# Patient Record
Sex: Female | Born: 1935 | Race: White | Hispanic: No | Smoking: Never smoker
Health system: Southern US, Community
[De-identification: ages and names within clinical notes are randomized; demographics above are authoritative.]

## PROBLEM LIST (undated history)

## (undated) DIAGNOSIS — I480 Paroxysmal atrial fibrillation: Secondary | ICD-10-CM

## (undated) DIAGNOSIS — E785 Hyperlipidemia, unspecified: Secondary | ICD-10-CM

## (undated) DIAGNOSIS — I350 Nonrheumatic aortic (valve) stenosis: Secondary | ICD-10-CM

## (undated) DIAGNOSIS — Z7901 Long term (current) use of anticoagulants: Secondary | ICD-10-CM

## (undated) DIAGNOSIS — D510 Vitamin B12 deficiency anemia due to intrinsic factor deficiency: Secondary | ICD-10-CM

## (undated) DIAGNOSIS — M8580 Other specified disorders of bone density and structure, unspecified site: Secondary | ICD-10-CM

## (undated) DIAGNOSIS — K449 Diaphragmatic hernia without obstruction or gangrene: Secondary | ICD-10-CM

## (undated) DIAGNOSIS — R42 Dizziness and giddiness: Secondary | ICD-10-CM

## (undated) DIAGNOSIS — S4292XA Fracture of left shoulder girdle, part unspecified, initial encounter for closed fracture: Secondary | ICD-10-CM

## (undated) DIAGNOSIS — E538 Deficiency of other specified B group vitamins: Secondary | ICD-10-CM

## (undated) DIAGNOSIS — M1990 Unspecified osteoarthritis, unspecified site: Secondary | ICD-10-CM

## (undated) DIAGNOSIS — R0602 Shortness of breath: Secondary | ICD-10-CM

## (undated) DIAGNOSIS — R0601 Orthopnea: Secondary | ICD-10-CM

## (undated) DIAGNOSIS — R600 Localized edema: Secondary | ICD-10-CM

## (undated) DIAGNOSIS — I779 Disorder of arteries and arterioles, unspecified: Secondary | ICD-10-CM

## (undated) DIAGNOSIS — T148XXA Other injury of unspecified body region, initial encounter: Secondary | ICD-10-CM

## (undated) DIAGNOSIS — D509 Iron deficiency anemia, unspecified: Secondary | ICD-10-CM

## (undated) DIAGNOSIS — Z952 Presence of prosthetic heart valve: Secondary | ICD-10-CM

## (undated) DIAGNOSIS — I10 Essential (primary) hypertension: Secondary | ICD-10-CM

## (undated) DIAGNOSIS — I2510 Atherosclerotic heart disease of native coronary artery without angina pectoris: Secondary | ICD-10-CM

## (undated) DIAGNOSIS — K219 Gastro-esophageal reflux disease without esophagitis: Secondary | ICD-10-CM

## (undated) DIAGNOSIS — I2119 ST elevation (STEMI) myocardial infarction involving other coronary artery of inferior wall: Secondary | ICD-10-CM

## (undated) DIAGNOSIS — I472 Ventricular tachycardia: Secondary | ICD-10-CM

## (undated) DIAGNOSIS — I4729 Other ventricular tachycardia: Secondary | ICD-10-CM

## (undated) DIAGNOSIS — I739 Peripheral vascular disease, unspecified: Secondary | ICD-10-CM

## (undated) DIAGNOSIS — R0789 Other chest pain: Secondary | ICD-10-CM

## (undated) DIAGNOSIS — E119 Type 2 diabetes mellitus without complications: Secondary | ICD-10-CM

## (undated) DIAGNOSIS — Z86711 Personal history of pulmonary embolism: Secondary | ICD-10-CM

## (undated) HISTORY — DX: Ventricular tachycardia: I47.2

## (undated) HISTORY — DX: Hyperlipidemia, unspecified: E78.5

## (undated) HISTORY — DX: Fracture of left shoulder girdle, part unspecified, initial encounter for closed fracture: S42.92XA

## (undated) HISTORY — DX: ST elevation (STEMI) myocardial infarction involving other coronary artery of inferior wall: I21.19

## (undated) HISTORY — DX: Vitamin B12 deficiency anemia due to intrinsic factor deficiency: D51.0

## (undated) HISTORY — DX: Personal history of pulmonary embolism: Z86.711

## (undated) HISTORY — DX: Disorder of arteries and arterioles, unspecified: I77.9

## (undated) HISTORY — DX: Other chest pain: R07.89

## (undated) HISTORY — DX: Other ventricular tachycardia: I47.29

## (undated) HISTORY — PX: CHOLECYSTECTOMY: SHX55

## (undated) HISTORY — DX: Shortness of breath: R06.02

## (undated) HISTORY — DX: Type 2 diabetes mellitus without complications: E11.9

## (undated) HISTORY — DX: Unspecified osteoarthritis, unspecified site: M19.90

## (undated) HISTORY — PX: OTHER SURGICAL HISTORY: SHX169

## (undated) HISTORY — DX: Diaphragmatic hernia without obstruction or gangrene: K44.9

## (undated) HISTORY — DX: Orthopnea: R06.01

## (undated) HISTORY — DX: Presence of prosthetic heart valve: Z95.2

## (undated) HISTORY — DX: Gastro-esophageal reflux disease without esophagitis: K21.9

## (undated) HISTORY — DX: Paroxysmal atrial fibrillation: I48.0

## (undated) HISTORY — DX: Dizziness and giddiness: R42

## (undated) HISTORY — DX: Iron deficiency anemia, unspecified: D50.9

## (undated) HISTORY — DX: Deficiency of other specified B group vitamins: E53.8

## (undated) HISTORY — DX: Other specified disorders of bone density and structure, unspecified site: M85.80

## (undated) HISTORY — DX: Peripheral vascular disease, unspecified: I73.9

## (undated) HISTORY — DX: Localized edema: R60.0

## (undated) HISTORY — DX: Essential (primary) hypertension: I10

## (undated) HISTORY — DX: Nonrheumatic aortic (valve) stenosis: I35.0

## (undated) HISTORY — DX: Long term (current) use of anticoagulants: Z79.01

## (undated) HISTORY — DX: Other injury of unspecified body region, initial encounter: T14.8XXA

## (undated) HISTORY — DX: Atherosclerotic heart disease of native coronary artery without angina pectoris: I25.10

---

## 1998-11-20 ENCOUNTER — Other Ambulatory Visit: Admission: RE | Admit: 1998-11-20 | Discharge: 1998-11-20 | Payer: Self-pay | Admitting: Family Medicine

## 1999-07-29 ENCOUNTER — Encounter: Payer: Self-pay | Admitting: Gastroenterology

## 1999-07-29 ENCOUNTER — Ambulatory Visit (HOSPITAL_COMMUNITY): Admission: RE | Admit: 1999-07-29 | Discharge: 1999-07-29 | Payer: Self-pay | Admitting: Gastroenterology

## 1999-10-28 ENCOUNTER — Encounter: Payer: Self-pay | Admitting: Gastroenterology

## 1999-10-28 ENCOUNTER — Ambulatory Visit (HOSPITAL_COMMUNITY): Admission: RE | Admit: 1999-10-28 | Discharge: 1999-10-28 | Payer: Self-pay | Admitting: Gastroenterology

## 1999-12-22 ENCOUNTER — Encounter: Payer: Self-pay | Admitting: Family Medicine

## 1999-12-22 ENCOUNTER — Encounter: Admission: RE | Admit: 1999-12-22 | Discharge: 1999-12-22 | Payer: Self-pay | Admitting: Family Medicine

## 1999-12-23 ENCOUNTER — Other Ambulatory Visit: Admission: RE | Admit: 1999-12-23 | Discharge: 1999-12-23 | Payer: Self-pay | Admitting: Family Medicine

## 2000-11-11 ENCOUNTER — Other Ambulatory Visit: Admission: RE | Admit: 2000-11-11 | Discharge: 2000-11-11 | Payer: Self-pay | Admitting: Internal Medicine

## 2001-07-11 ENCOUNTER — Encounter: Admission: RE | Admit: 2001-07-11 | Discharge: 2001-07-11 | Payer: Self-pay | Admitting: Internal Medicine

## 2001-07-11 ENCOUNTER — Encounter: Payer: Self-pay | Admitting: Internal Medicine

## 2002-04-24 ENCOUNTER — Ambulatory Visit (HOSPITAL_COMMUNITY): Admission: RE | Admit: 2002-04-24 | Discharge: 2002-04-24 | Payer: Self-pay | Admitting: Family Medicine

## 2002-04-24 ENCOUNTER — Encounter: Payer: Self-pay | Admitting: Family Medicine

## 2003-02-09 ENCOUNTER — Emergency Department (HOSPITAL_COMMUNITY): Admission: EM | Admit: 2003-02-09 | Discharge: 2003-02-09 | Payer: Self-pay

## 2003-02-09 ENCOUNTER — Encounter: Payer: Self-pay | Admitting: Emergency Medicine

## 2003-02-26 ENCOUNTER — Ambulatory Visit (HOSPITAL_COMMUNITY): Admission: RE | Admit: 2003-02-26 | Discharge: 2003-02-26 | Payer: Self-pay | Admitting: Family Medicine

## 2003-02-26 ENCOUNTER — Encounter: Payer: Self-pay | Admitting: Family Medicine

## 2003-03-05 ENCOUNTER — Encounter: Payer: Self-pay | Admitting: Family Medicine

## 2003-03-05 ENCOUNTER — Ambulatory Visit (HOSPITAL_COMMUNITY): Admission: RE | Admit: 2003-03-05 | Discharge: 2003-03-05 | Payer: Self-pay | Admitting: Family Medicine

## 2003-03-26 ENCOUNTER — Inpatient Hospital Stay (HOSPITAL_COMMUNITY): Admission: RE | Admit: 2003-03-26 | Discharge: 2003-03-28 | Payer: Self-pay | Admitting: Obstetrics and Gynecology

## 2003-03-26 ENCOUNTER — Encounter (INDEPENDENT_AMBULATORY_CARE_PROVIDER_SITE_OTHER): Payer: Self-pay

## 2003-04-08 ENCOUNTER — Emergency Department (HOSPITAL_COMMUNITY): Admission: EM | Admit: 2003-04-08 | Discharge: 2003-04-08 | Payer: Self-pay | Admitting: Emergency Medicine

## 2003-08-13 ENCOUNTER — Ambulatory Visit (HOSPITAL_COMMUNITY): Admission: RE | Admit: 2003-08-13 | Discharge: 2003-08-13 | Payer: Self-pay | Admitting: Family Medicine

## 2003-08-20 ENCOUNTER — Encounter: Admission: RE | Admit: 2003-08-20 | Discharge: 2003-08-20 | Payer: Self-pay | Admitting: Obstetrics and Gynecology

## 2004-02-18 ENCOUNTER — Ambulatory Visit (HOSPITAL_COMMUNITY): Admission: RE | Admit: 2004-02-18 | Discharge: 2004-02-18 | Payer: Self-pay | Admitting: Family Medicine

## 2004-02-27 ENCOUNTER — Ambulatory Visit (HOSPITAL_COMMUNITY): Admission: RE | Admit: 2004-02-27 | Discharge: 2004-02-27 | Payer: Self-pay | Admitting: Family Medicine

## 2004-03-10 ENCOUNTER — Emergency Department (HOSPITAL_COMMUNITY): Admission: EM | Admit: 2004-03-10 | Discharge: 2004-03-10 | Payer: Self-pay | Admitting: Emergency Medicine

## 2004-04-08 ENCOUNTER — Ambulatory Visit (HOSPITAL_COMMUNITY): Admission: RE | Admit: 2004-04-08 | Discharge: 2004-04-09 | Payer: Self-pay | Admitting: General Surgery

## 2004-04-08 ENCOUNTER — Encounter (INDEPENDENT_AMBULATORY_CARE_PROVIDER_SITE_OTHER): Payer: Self-pay | Admitting: *Deleted

## 2004-12-24 ENCOUNTER — Encounter: Admission: RE | Admit: 2004-12-24 | Discharge: 2004-12-24 | Payer: Self-pay | Admitting: Family Medicine

## 2006-04-11 ENCOUNTER — Encounter: Admission: RE | Admit: 2006-04-11 | Discharge: 2006-04-11 | Payer: Self-pay | Admitting: Gastroenterology

## 2006-07-04 ENCOUNTER — Ambulatory Visit (HOSPITAL_COMMUNITY): Admission: RE | Admit: 2006-07-04 | Discharge: 2006-07-04 | Payer: Self-pay | Admitting: Gastroenterology

## 2006-07-22 ENCOUNTER — Encounter: Admission: RE | Admit: 2006-07-22 | Discharge: 2006-07-22 | Payer: Self-pay | Admitting: Surgery

## 2007-04-07 ENCOUNTER — Encounter: Admission: RE | Admit: 2007-04-07 | Discharge: 2007-04-07 | Payer: Self-pay | Admitting: Obstetrics and Gynecology

## 2007-05-05 ENCOUNTER — Encounter: Admission: RE | Admit: 2007-05-05 | Discharge: 2007-05-05 | Payer: Self-pay | Admitting: Surgery

## 2007-06-17 ENCOUNTER — Emergency Department (HOSPITAL_COMMUNITY): Admission: EM | Admit: 2007-06-17 | Discharge: 2007-06-17 | Payer: Self-pay | Admitting: Family Medicine

## 2007-06-19 ENCOUNTER — Ambulatory Visit: Admission: RE | Admit: 2007-06-19 | Discharge: 2007-06-19 | Payer: Self-pay | Admitting: Surgery

## 2007-08-07 ENCOUNTER — Inpatient Hospital Stay (HOSPITAL_COMMUNITY): Admission: RE | Admit: 2007-08-07 | Discharge: 2007-08-09 | Payer: Self-pay | Admitting: Surgery

## 2007-10-04 ENCOUNTER — Encounter: Admission: RE | Admit: 2007-10-04 | Discharge: 2007-10-04 | Payer: Self-pay | Admitting: Surgery

## 2008-04-09 ENCOUNTER — Encounter: Admission: RE | Admit: 2008-04-09 | Discharge: 2008-04-09 | Payer: Self-pay | Admitting: Family Medicine

## 2008-04-18 ENCOUNTER — Encounter: Admission: RE | Admit: 2008-04-18 | Discharge: 2008-04-18 | Payer: Self-pay | Admitting: Family Medicine

## 2008-04-25 ENCOUNTER — Encounter: Admission: RE | Admit: 2008-04-25 | Discharge: 2008-04-25 | Payer: Self-pay | Admitting: Family Medicine

## 2008-06-26 ENCOUNTER — Encounter (INDEPENDENT_AMBULATORY_CARE_PROVIDER_SITE_OTHER): Payer: Self-pay | Admitting: Family Medicine

## 2008-06-26 ENCOUNTER — Ambulatory Visit (HOSPITAL_COMMUNITY): Admission: RE | Admit: 2008-06-26 | Discharge: 2008-06-26 | Payer: Self-pay | Admitting: Family Medicine

## 2008-06-26 ENCOUNTER — Ambulatory Visit: Payer: Self-pay | Admitting: Vascular Surgery

## 2008-12-09 ENCOUNTER — Inpatient Hospital Stay (HOSPITAL_COMMUNITY): Admission: AD | Admit: 2008-12-09 | Discharge: 2008-12-13 | Payer: Self-pay | Admitting: Cardiology

## 2008-12-11 ENCOUNTER — Ambulatory Visit: Payer: Self-pay | Admitting: Surgery

## 2008-12-11 ENCOUNTER — Encounter: Payer: Self-pay | Admitting: Cardiology

## 2008-12-12 DIAGNOSIS — Z952 Presence of prosthetic heart valve: Secondary | ICD-10-CM

## 2008-12-12 DIAGNOSIS — I2510 Atherosclerotic heart disease of native coronary artery without angina pectoris: Secondary | ICD-10-CM | POA: Insufficient documentation

## 2008-12-12 HISTORY — PX: AORTIC VALVE REPLACEMENT: SHX41

## 2008-12-12 HISTORY — DX: Atherosclerotic heart disease of native coronary artery without angina pectoris: I25.10

## 2008-12-12 HISTORY — DX: Presence of prosthetic heart valve: Z95.2

## 2008-12-12 HISTORY — PX: CORONARY ARTERY BYPASS GRAFT: SHX141

## 2008-12-30 ENCOUNTER — Ambulatory Visit: Payer: Self-pay | Admitting: Thoracic Surgery (Cardiothoracic Vascular Surgery)

## 2009-01-06 ENCOUNTER — Encounter: Payer: Self-pay | Admitting: Thoracic Surgery (Cardiothoracic Vascular Surgery)

## 2009-01-06 ENCOUNTER — Ambulatory Visit (HOSPITAL_COMMUNITY)
Admission: RE | Admit: 2009-01-06 | Discharge: 2009-01-06 | Payer: Self-pay | Admitting: Thoracic Surgery (Cardiothoracic Vascular Surgery)

## 2009-01-10 ENCOUNTER — Inpatient Hospital Stay (HOSPITAL_COMMUNITY)
Admission: RE | Admit: 2009-01-10 | Discharge: 2009-01-16 | Payer: Self-pay | Admitting: Thoracic Surgery (Cardiothoracic Vascular Surgery)

## 2009-01-10 ENCOUNTER — Encounter: Payer: Self-pay | Admitting: Thoracic Surgery (Cardiothoracic Vascular Surgery)

## 2009-01-10 ENCOUNTER — Ambulatory Visit: Payer: Self-pay | Admitting: Thoracic Surgery (Cardiothoracic Vascular Surgery)

## 2009-02-10 ENCOUNTER — Encounter
Admission: RE | Admit: 2009-02-10 | Discharge: 2009-02-10 | Payer: Self-pay | Admitting: Thoracic Surgery (Cardiothoracic Vascular Surgery)

## 2009-02-10 ENCOUNTER — Ambulatory Visit: Payer: Self-pay | Admitting: Thoracic Surgery (Cardiothoracic Vascular Surgery)

## 2009-03-20 ENCOUNTER — Encounter: Payer: PPO | Admitting: Cardiology

## 2009-04-07 ENCOUNTER — Ambulatory Visit: Payer: Self-pay | Admitting: Thoracic Surgery (Cardiothoracic Vascular Surgery)

## 2009-04-14 ENCOUNTER — Encounter: Payer: PPO | Admitting: Cardiology

## 2009-07-15 HISTORY — PX: OTHER SURGICAL HISTORY: SHX169

## 2009-12-14 ENCOUNTER — Emergency Department (HOSPITAL_COMMUNITY): Admission: EM | Admit: 2009-12-14 | Discharge: 2009-12-14 | Payer: Self-pay | Admitting: Family Medicine

## 2010-02-12 DIAGNOSIS — S4292XA Fracture of left shoulder girdle, part unspecified, initial encounter for closed fracture: Secondary | ICD-10-CM

## 2010-02-12 DIAGNOSIS — I2119 ST elevation (STEMI) myocardial infarction involving other coronary artery of inferior wall: Secondary | ICD-10-CM

## 2010-02-12 HISTORY — DX: Fracture of left shoulder girdle, part unspecified, initial encounter for closed fracture: S42.92XA

## 2010-02-12 HISTORY — PX: CARDIAC CATHETERIZATION: SHX172

## 2010-02-12 HISTORY — DX: ST elevation (STEMI) myocardial infarction involving other coronary artery of inferior wall: I21.19

## 2010-02-18 ENCOUNTER — Ambulatory Visit: Payer: Self-pay | Admitting: Cardiology

## 2010-02-18 ENCOUNTER — Ambulatory Visit (HOSPITAL_COMMUNITY): Admission: RE | Admit: 2010-02-18 | Discharge: 2010-02-18 | Payer: Self-pay | Admitting: Cardiology

## 2010-02-24 ENCOUNTER — Inpatient Hospital Stay (HOSPITAL_COMMUNITY): Admission: EM | Admit: 2010-02-24 | Discharge: 2010-02-28 | Payer: Self-pay | Admitting: Emergency Medicine

## 2010-02-24 ENCOUNTER — Ambulatory Visit: Payer: Self-pay | Admitting: Internal Medicine

## 2010-02-24 ENCOUNTER — Ambulatory Visit: Payer: Self-pay | Admitting: Cardiology

## 2010-03-09 ENCOUNTER — Ambulatory Visit: Payer: Self-pay | Admitting: Cardiology

## 2010-04-01 ENCOUNTER — Ambulatory Visit: Payer: Self-pay | Admitting: Cardiology

## 2010-04-13 ENCOUNTER — Telehealth (INDEPENDENT_AMBULATORY_CARE_PROVIDER_SITE_OTHER): Payer: Self-pay | Admitting: Radiology

## 2010-04-14 ENCOUNTER — Ambulatory Visit: Payer: Self-pay

## 2010-04-14 ENCOUNTER — Encounter: Payer: Self-pay | Admitting: Cardiovascular Disease

## 2010-04-14 ENCOUNTER — Encounter (HOSPITAL_COMMUNITY)
Admission: RE | Admit: 2010-04-14 | Discharge: 2010-06-13 | Payer: Self-pay | Source: Home / Self Care | Attending: Cardiology | Admitting: Cardiology

## 2010-04-14 ENCOUNTER — Ambulatory Visit: Payer: Self-pay | Admitting: Cardiovascular Disease

## 2010-05-27 ENCOUNTER — Inpatient Hospital Stay (HOSPITAL_COMMUNITY)
Admission: RE | Admit: 2010-05-27 | Discharge: 2010-05-29 | Payer: Self-pay | Source: Home / Self Care | Attending: Orthopedic Surgery | Admitting: Orthopedic Surgery

## 2010-07-06 ENCOUNTER — Ambulatory Visit: Payer: Self-pay | Admitting: Cardiology

## 2010-07-14 NOTE — Progress Notes (Signed)
Summary: nuc pre-procedure  Phone Note Outgoing Call   Call placed by: Domenic Polite, CNMT,  April 13, 2010 2:11 PM Call placed to: Patient Reason for Call: Confirm/change Appt Summary of Call: Reviewed information on [employer] Information Sheet (see scanned document for further details).  Spoke with patient.  Initial call taken by: Domenic Polite, CNMT,  April 13, 2010 2:11 PM     Nuclear Med Background Indications for Stress Test: Evaluation for Ischemia, Surgical Clearance  Indications Comments: Pending Lt arm surgery  History: CABG, Echo, Heart Catheterization, Myocardial Infarction  History Comments: 6/10 Echo EF= 55-60% , 7/10 CABG LAD /AVR, 02/24/10 INF MI , 02/25/10 Cath atretic LIMA-LAD / Thombus RCA, H/O A-Fib , H/O NSVT     Nuclear Pre-Procedure Cardiac Risk Factors: Hypertension, Lipids, NIDDM

## 2010-07-14 NOTE — Assessment & Plan Note (Signed)
Summary: Cardiology Nuclear Testing  Nuclear Med Background Indications for Stress Test: Evaluation for Ischemia, Surgical Clearance, Graft Patency  Indications Comments: Pending (L) arm surgery by Dr. Fayrene Fearing [place]  History: CABG, Echo, Heart Catheterization, Myocardial Infarction  History Comments: 6/10 [id number]  Symptoms: Chest Pain, Fatigue  Symptoms Comments: Last episode of [id number] since MI, 02/24/10.   Nuclear Pre-Procedure Cardiac Risk Factors: Family History - CAD, Hypertension, Lipids, NIDDM, Obesity Caffeine/Decaff Intake: None NPO After: 8:00 PM Lungs: Clear.  O2 Sat 98% on RA. IV 0.9% NS with Angio Cath: 24g     IV Site: R Hand IV Started by: Irean Hong, RN Chest Size (in) 38     Cup Size C     Height (in): 65.5 Weight (lb): 202 BMI: 33.22 Tech Comments: Held metoprolol this am.  Nuclear Med Study 1 or 2 day study:  1 day     Stress Test Type:  Eugenie Birks Reading MD:  Kristeen Miss, MD     Referring MD:  Cassell Clement, MD Resting Radionuclide:  Technetium 68m Tetrofosmin     Resting Radionuclide Dose:  11.0 mCi  Stress Radionuclide:  Technetium 33m Tetrofosmin     Stress Radionuclide Dose:  33.0 mCi   Stress Protocol   Lexiscan: 0.4 mg   Stress Test Technologist:  [employer], CMA-N     Nuclear Technologist:  Domenic Polite, CNMT  Rest Procedure  Myocardial perfusion imaging was performed at rest 45 minutes following the intravenous administration of Technetium 70m Tetrofosmin.  Stress Procedure  The patient received IV Lexiscan 0.4 mg over 15-seconds.  Technetium 65m Tetrofosmin injected at 30-seconds.  There were no significant changes with infusion; occasional PVC's.  Quantitative spect images were obtained after a 45 minute delay.  QPS Raw Data Images:  Normal; no motion artifact; normal heart/lung ratio. Stress Images:  Normal homogeneous uptake in all areas of the myocardium. Rest Images:  Normal homogeneous uptake in all areas of the  myocardium. Subtraction (SDS):  No evidence of ischemia. Transient Ischemic Dilatation:  .96  (Normal <1.22)  Lung/Heart Ratio:  .39  (Normal <0.45)  Quantitative Gated Spect Images QGS EDV:  51 ml QGS ESV:  12 ml QGS EF:  77 % QGS cine images:  There is vigorous LV systolic function.  Findings Normal nuclear study      Overall Impression  Exercise Capacity: Lexiscan with no exercise. BP Response: Normal blood pressure response. Clinical Symptoms: No chest pain ECG Impression: No significant ST segment change suggestive of ischemia.  There were frequent PVCs. Overall Impression: Normal stress nuclear study. Overall Impression Comments: No evidence of ischemia.  Normal LV function.

## 2010-07-17 ENCOUNTER — Ambulatory Visit (INDEPENDENT_AMBULATORY_CARE_PROVIDER_SITE_OTHER): Payer: Medicare Other | Admitting: *Deleted

## 2010-07-17 DIAGNOSIS — I119 Hypertensive heart disease without heart failure: Secondary | ICD-10-CM

## 2010-07-17 DIAGNOSIS — I4891 Unspecified atrial fibrillation: Secondary | ICD-10-CM

## 2010-07-17 DIAGNOSIS — Z79899 Other long term (current) drug therapy: Secondary | ICD-10-CM

## 2010-08-04 ENCOUNTER — Other Ambulatory Visit (INDEPENDENT_AMBULATORY_CARE_PROVIDER_SITE_OTHER): Payer: Medicare Other

## 2010-08-04 DIAGNOSIS — I10 Essential (primary) hypertension: Secondary | ICD-10-CM

## 2010-08-04 DIAGNOSIS — I2510 Atherosclerotic heart disease of native coronary artery without angina pectoris: Secondary | ICD-10-CM

## 2010-08-25 LAB — COMPREHENSIVE METABOLIC PANEL
ALT: 13 U/L (ref 0–35)
AST: 20 U/L (ref 0–37)
Albumin: 3.5 g/dL (ref 3.5–5.2)
Calcium: 9 mg/dL (ref 8.4–10.5)
Creatinine, Ser: 1.01 mg/dL (ref 0.4–1.2)
GFR calc non Af Amer: 54 mL/min — ABNORMAL LOW (ref >60)
Sodium: 137 mEq/L (ref 135–145)
Total Protein: 6.8 g/dL (ref 6.0–8.3)

## 2010-08-25 LAB — CBC
MCH: 29 pg (ref 26.0–34.0)
MCHC: 33.4 g/dL (ref 30.0–36.0)
MCV: 86.8 fL (ref 78.0–100.0)
Platelets: 272 10*3/uL (ref 150–400)
RBC: 4.55 MIL/uL (ref 3.87–5.11)
RDW: 13.7 % (ref 11.5–15.5)

## 2010-08-25 LAB — GLUCOSE, CAPILLARY: Glucose-Capillary: 125 mg/dL — ABNORMAL HIGH (ref 70–99)

## 2010-08-25 LAB — SURGICAL PCR SCREEN: MRSA, PCR: NEGATIVE

## 2010-08-27 LAB — COMPREHENSIVE METABOLIC PANEL
ALT: 51 U/L — ABNORMAL HIGH (ref 0–35)
ALT: 59 U/L — ABNORMAL HIGH (ref 0–35)
ALT: 15 U/L (ref 0–35)
AST: 165 U/L — ABNORMAL HIGH (ref 0–37)
AST: 92 U/L — ABNORMAL HIGH (ref 0–37)
Albumin: 2.9 g/dL — ABNORMAL LOW (ref 3.5–5.2)
Albumin: 3.4 g/dL — ABNORMAL LOW (ref 3.5–5.2)
Alkaline Phosphatase: 100 U/L (ref 39–117)
Alkaline Phosphatase: 99 U/L (ref 39–117)
Alkaline Phosphatase: 119 U/L — ABNORMAL HIGH (ref 39–117)
BUN: 22 mg/dL (ref 6–23)
CO2: 19 mEq/L (ref 19–32)
CO2: 22 mEq/L (ref 19–32)
Calcium: 8.5 mg/dL (ref 8.4–10.5)
Chloride: 106 mEq/L (ref 96–112)
Chloride: 106 mEq/L (ref 96–112)
Creatinine, Ser: 0.88 mg/dL (ref 0.4–1.2)
GFR calc Af Amer: >60 mL/min (ref >60)
GFR calc non Af Amer: >60 mL/min (ref >60)
Glucose, Bld: 119 mg/dL — ABNORMAL HIGH (ref 70–99)
Glucose, Bld: 190 mg/dL — ABNORMAL HIGH (ref 70–99)
Potassium: 3.8 mEq/L (ref 3.5–5.1)
Potassium: 4 mEq/L (ref 3.5–5.1)
Potassium: 3.8 mEq/L (ref 3.5–5.1)
Sodium: 134 mEq/L — ABNORMAL LOW (ref 135–145)
Sodium: 140 mEq/L (ref 135–145)
Sodium: 136 mEq/L (ref 135–145)
Total Bilirubin: 1 mg/dL (ref 0.3–1.2)
Total Bilirubin: 0.7 mg/dL (ref 0.3–1.2)
Total Protein: 5.7 g/dL — ABNORMAL LOW (ref 6.0–8.3)
Total Protein: 6.8 g/dL (ref 6.0–8.3)

## 2010-08-27 LAB — CBC
HCT: 35.6 % — ABNORMAL LOW (ref 36.0–46.0)
HCT: 39.1 % (ref 36.0–46.0)
Hemoglobin: 11 g/dL — ABNORMAL LOW (ref 12.0–15.0)
Hemoglobin: 11.4 g/dL — ABNORMAL LOW (ref 12.0–15.0)
Hemoglobin: 11.7 g/dL — ABNORMAL LOW (ref 12.0–15.0)
MCH: 28.5 pg (ref 26.0–34.0)
MCH: 28.5 pg (ref 26.0–34.0)
MCH: 28.6 pg (ref 26.0–34.0)
MCHC: 32.6 g/dL (ref 30.0–36.0)
MCHC: 32.8 g/dL (ref 30.0–36.0)
MCHC: 33.5 g/dL (ref 30.0–36.0)
MCV: 87.4 fL (ref 78.0–100.0)
MCV: 85.2 fL (ref 78.0–100.0)
Platelets: 199 10*3/uL (ref 150–400)
Platelets: 202 10*3/uL (ref 150–400)
Platelets: 282 10*3/uL (ref 150–400)
RBC: 3.84 MIL/uL — ABNORMAL LOW (ref 3.87–5.11)
RBC: 4 MIL/uL (ref 3.87–5.11)
RBC: 4.18 MIL/uL (ref 3.87–5.11)
RDW: 13.7 % (ref 11.5–15.5)
RDW: 13.6 % (ref 11.5–15.5)
RDW: 13.9 % (ref 11.5–15.5)
WBC: 10.5 10*3/uL (ref 4.0–10.5)
WBC: 15.8 10*3/uL — ABNORMAL HIGH (ref 4.0–10.5)
WBC: 9.4 10*3/uL (ref 4.0–10.5)

## 2010-08-27 LAB — BASIC METABOLIC PANEL
BUN: 21 mg/dL (ref 6–23)
BUN: 22 mg/dL (ref 6–23)
CO2: 16 mEq/L — ABNORMAL LOW (ref 19–32)
Chloride: 112 mEq/L (ref 96–112)
Creatinine, Ser: 1.2 mg/dL (ref 0.4–1.2)
Creatinine, Ser: 1.38 mg/dL — ABNORMAL HIGH (ref 0.4–1.2)
GFR calc non Af Amer: 38 mL/min — ABNORMAL LOW (ref >60)
Glucose, Bld: 167 mg/dL — ABNORMAL HIGH (ref 70–99)
Glucose, Bld: 196 mg/dL — ABNORMAL HIGH (ref 70–99)
Potassium: 4.4 mEq/L (ref 3.5–5.1)

## 2010-08-27 LAB — IRON AND TIBC
Saturation Ratios: 9 % — ABNORMAL LOW (ref 20–55)
UIBC: 314 ug/dL

## 2010-08-27 LAB — HEMOGLOBIN A1C
Hgb A1c MFr Bld: 6.2 % — ABNORMAL HIGH (ref <5.7)
Mean Plasma Glucose: 131 mg/dL — ABNORMAL HIGH (ref <117)
Mean Plasma Glucose: 126 mg/dL — ABNORMAL HIGH (ref <117)

## 2010-08-27 LAB — CARDIAC PANEL(CRET KIN+CKTOT+MB+TROPI)
CK, MB: 212.6 ng/mL (ref 0.3–4.0)
CK, MB: 316.5 ng/mL (ref 0.3–4.0)
Relative Index: 15 — ABNORMAL HIGH (ref 0.0–2.5)
Total CK: 2352 U/L — ABNORMAL HIGH (ref 7–177)
Troponin I: 53.9 ng/mL (ref 0.00–0.06)

## 2010-08-27 LAB — POCT CARDIAC MARKERS
CKMB, poc: 1 ng/mL (ref 1.0–8.0)
Troponin i, poc: <0.05 ng/mL (ref 0.00–0.09)

## 2010-08-27 LAB — HEPARIN LEVEL (UNFRACTIONATED)
Heparin Unfractionated: 0.26 IU/mL — ABNORMAL LOW (ref 0.30–0.70)
Heparin Unfractionated: <0.1 IU/mL — ABNORMAL LOW (ref 0.30–0.70)

## 2010-08-27 LAB — LIPID PANEL
Cholesterol: 119 mg/dL (ref 0–200)
HDL: 48 mg/dL (ref >39)

## 2010-08-27 LAB — RETICULOCYTES: RBC.: 3.85 MIL/uL — ABNORMAL LOW (ref 3.87–5.11)

## 2010-08-27 LAB — PROTIME-INR: INR: 0.95 (ref 0.00–1.49)

## 2010-08-27 LAB — VITAMIN B12: Vitamin B-12: 382 pg/mL (ref 211–911)

## 2010-08-27 LAB — FERRITIN: Ferritin: 13 ng/mL (ref 10–291)

## 2010-08-27 LAB — APTT: aPTT: 26 seconds (ref 24–37)

## 2010-09-16 ENCOUNTER — Encounter: Payer: Self-pay | Admitting: Nurse Practitioner

## 2010-09-17 ENCOUNTER — Encounter: Payer: Self-pay | Admitting: Nurse Practitioner

## 2010-09-18 ENCOUNTER — Ambulatory Visit: Payer: No Typology Code available for payment source | Admitting: Nurse Practitioner

## 2010-09-19 LAB — PROTIME-INR
INR: 1.3 (ref 0.00–1.49)
INR: 1.4 (ref 0.00–1.49)
INR: 1.5 (ref 0.00–1.49)
Prothrombin Time: 17.6 seconds — ABNORMAL HIGH (ref 11.6–15.2)
Prothrombin Time: 17.7 seconds — ABNORMAL HIGH (ref 11.6–15.2)
Prothrombin Time: 19.3 seconds — ABNORMAL HIGH (ref 11.6–15.2)

## 2010-09-19 LAB — CBC
HCT: 31.2 % — ABNORMAL LOW (ref 36.0–46.0)
HCT: 32.1 % — ABNORMAL LOW (ref 36.0–46.0)
Hemoglobin: 11 g/dL — ABNORMAL LOW (ref 12.0–15.0)
Hemoglobin: 11.4 g/dL — ABNORMAL LOW (ref 12.0–15.0)
MCHC: 33.7 g/dL (ref 30.0–36.0)
MCV: 92.4 fL (ref 78.0–100.0)
Platelets: 104 10*3/uL — ABNORMAL LOW (ref 150–400)
Platelets: 105 10*3/uL — ABNORMAL LOW (ref 150–400)
RDW: 17.7 % — ABNORMAL HIGH (ref 11.5–15.5)
RDW: 18.3 % — ABNORMAL HIGH (ref 11.5–15.5)
RDW: 18.4 % — ABNORMAL HIGH (ref 11.5–15.5)
WBC: 10.6 10*3/uL — ABNORMAL HIGH (ref 4.0–10.5)

## 2010-09-19 LAB — GLUCOSE, CAPILLARY
Glucose-Capillary: 107 mg/dL — ABNORMAL HIGH (ref 70–99)
Glucose-Capillary: 144 mg/dL — ABNORMAL HIGH (ref 70–99)
Glucose-Capillary: 150 mg/dL — ABNORMAL HIGH (ref 70–99)
Glucose-Capillary: 154 mg/dL — ABNORMAL HIGH (ref 70–99)
Glucose-Capillary: 79 mg/dL (ref 70–99)
Glucose-Capillary: 88 mg/dL (ref 70–99)
Glucose-Capillary: 95 mg/dL (ref 70–99)

## 2010-09-19 LAB — BASIC METABOLIC PANEL
BUN: 14 mg/dL (ref 6–23)
BUN: 14 mg/dL (ref 6–23)
CO2: 27 mEq/L (ref 19–32)
Calcium: 8.5 mg/dL (ref 8.4–10.5)
Chloride: 104 mEq/L (ref 96–112)
GFR calc non Af Amer: 55 mL/min — ABNORMAL LOW (ref >60)
GFR calc non Af Amer: >60 mL/min (ref >60)
Glucose, Bld: 113 mg/dL — ABNORMAL HIGH (ref 70–99)
Glucose, Bld: 58 mg/dL — ABNORMAL LOW (ref 70–99)
Glucose, Bld: 93 mg/dL (ref 70–99)
Potassium: 4.1 mEq/L (ref 3.5–5.1)
Potassium: 4.6 mEq/L (ref 3.5–5.1)
Sodium: 133 mEq/L — ABNORMAL LOW (ref 135–145)
Sodium: 135 mEq/L (ref 135–145)

## 2010-09-19 LAB — POCT I-STAT, CHEM 8
Chloride: 99 mEq/L (ref 96–112)
Creatinine, Ser: 1.1 mg/dL (ref 0.4–1.2)
Glucose, Bld: 97 mg/dL (ref 70–99)
Hemoglobin: 16.3 g/dL — ABNORMAL HIGH (ref 12.0–15.0)
Potassium: 4.1 mEq/L (ref 3.5–5.1)
Sodium: 135 mEq/L (ref 135–145)

## 2010-09-20 ENCOUNTER — Other Ambulatory Visit: Payer: Self-pay | Admitting: Cardiology

## 2010-09-20 LAB — TYPE AND SCREEN: Antibody Screen: NEGATIVE

## 2010-09-20 LAB — POCT I-STAT 3, ART BLOOD GAS (G3+)
Acid-base deficit: 2 mmol/L (ref 0.0–2.0)
Acid-base deficit: 6 mmol/L — ABNORMAL HIGH (ref 0.0–2.0)
Bicarbonate: 21.2 mEq/L (ref 20.0–24.0)
Bicarbonate: 23.6 mEq/L (ref 20.0–24.0)
O2 Saturation: 100 %
O2 Saturation: 99 %
Patient temperature: 35.6
Patient temperature: 36.7
TCO2: 25 mmol/L (ref 0–100)
pH, Arterial: 7.368 (ref 7.350–7.400)
pH, Arterial: 7.398 (ref 7.350–7.400)
pO2, Arterial: 130 mmHg — ABNORMAL HIGH (ref 80.0–100.0)

## 2010-09-20 LAB — GLUCOSE, CAPILLARY
Glucose-Capillary: 102 mg/dL — ABNORMAL HIGH (ref 70–99)
Glucose-Capillary: 109 mg/dL — ABNORMAL HIGH (ref 70–99)
Glucose-Capillary: 119 mg/dL — ABNORMAL HIGH (ref 70–99)
Glucose-Capillary: 124 mg/dL — ABNORMAL HIGH (ref 70–99)
Glucose-Capillary: 157 mg/dL — ABNORMAL HIGH (ref 70–99)
Glucose-Capillary: 162 mg/dL — ABNORMAL HIGH (ref 70–99)
Glucose-Capillary: 163 mg/dL — ABNORMAL HIGH (ref 70–99)
Glucose-Capillary: 170 mg/dL — ABNORMAL HIGH (ref 70–99)
Glucose-Capillary: 192 mg/dL — ABNORMAL HIGH (ref 70–99)
Glucose-Capillary: 91 mg/dL (ref 70–99)
Glucose-Capillary: 99 mg/dL (ref 70–99)
Glucose-Capillary: 110 mg/dL — ABNORMAL HIGH (ref 70–99)
Glucose-Capillary: 91 mg/dL (ref 70–99)
Glucose-Capillary: 93 mg/dL (ref 70–99)
Glucose-Capillary: 97 mg/dL (ref 70–99)

## 2010-09-20 LAB — POCT I-STAT 4, (NA,K, GLUC, HGB,HCT)
Glucose, Bld: 118 mg/dL — ABNORMAL HIGH (ref 70–99)
Glucose, Bld: 136 mg/dL — ABNORMAL HIGH (ref 70–99)
Glucose, Bld: 60 mg/dL — ABNORMAL LOW (ref 70–99)
HCT: 24 % — ABNORMAL LOW (ref 36.0–46.0)
HCT: 26 % — ABNORMAL LOW (ref 36.0–46.0)
HCT: 27 % — ABNORMAL LOW (ref 36.0–46.0)
HCT: 29 % — ABNORMAL LOW (ref 36.0–46.0)
Hemoglobin: 8.2 g/dL — ABNORMAL LOW (ref 12.0–15.0)
Hemoglobin: 8.5 g/dL — ABNORMAL LOW (ref 12.0–15.0)
Hemoglobin: 8.8 g/dL — ABNORMAL LOW (ref 12.0–15.0)
Hemoglobin: 9.2 g/dL — ABNORMAL LOW (ref 12.0–15.0)
Potassium: 3.5 mEq/L (ref 3.5–5.1)
Potassium: 4.3 mEq/L (ref 3.5–5.1)
Potassium: 4.6 mEq/L (ref 3.5–5.1)
Sodium: 137 mEq/L (ref 135–145)
Sodium: 138 mEq/L (ref 135–145)
Sodium: 141 mEq/L (ref 135–145)
Sodium: 141 mEq/L (ref 135–145)

## 2010-09-20 LAB — CBC
HCT: 32.6 % — ABNORMAL LOW (ref 36.0–46.0)
HCT: 32.6 % — ABNORMAL LOW (ref 36.0–46.0)
HCT: 33.6 % — ABNORMAL LOW (ref 36.0–46.0)
Hemoglobin: 10.3 g/dL — ABNORMAL LOW (ref 12.0–15.0)
Hemoglobin: 11.1 g/dL — ABNORMAL LOW (ref 12.0–15.0)
Hemoglobin: 11.4 g/dL — ABNORMAL LOW (ref 12.0–15.0)
Hemoglobin: 9.1 g/dL — ABNORMAL LOW (ref 12.0–15.0)
MCHC: 34.3 g/dL (ref 30.0–36.0)
MCHC: 33.5 g/dL (ref 30.0–36.0)
MCV: 91.8 fL (ref 78.0–100.0)
MCV: 91.9 fL (ref 78.0–100.0)
Platelets: 104 10*3/uL — ABNORMAL LOW (ref 150–400)
Platelets: 105 10*3/uL — ABNORMAL LOW (ref 150–400)
Platelets: 97 10*3/uL — ABNORMAL LOW (ref 150–400)
Platelets: 157 10*3/uL (ref 150–400)
Platelets: 164 10*3/uL (ref 150–400)
RBC: 3.34 MIL/uL — ABNORMAL LOW (ref 3.87–5.11)
RBC: 2.88 MIL/uL — ABNORMAL LOW (ref 3.87–5.11)
RDW: 17.3 % — ABNORMAL HIGH (ref 11.5–15.5)
RDW: 18.4 % — ABNORMAL HIGH (ref 11.5–15.5)
RDW: 18.6 % — ABNORMAL HIGH (ref 11.5–15.5)
RDW: 16.5 % — ABNORMAL HIGH (ref 11.5–15.5)
WBC: 10.2 10*3/uL (ref 4.0–10.5)
WBC: 12 10*3/uL — ABNORMAL HIGH (ref 4.0–10.5)
WBC: 4.9 10*3/uL (ref 4.0–10.5)
WBC: 9.2 10*3/uL (ref 4.0–10.5)
WBC: 5.1 10*3/uL (ref 4.0–10.5)

## 2010-09-20 LAB — COMPREHENSIVE METABOLIC PANEL
Alkaline Phosphatase: 70 U/L (ref 39–117)
BUN: 17 mg/dL (ref 6–23)
CO2: 20 mEq/L (ref 19–32)
Chloride: 111 mEq/L (ref 96–112)
GFR calc non Af Amer: 53 mL/min — ABNORMAL LOW (ref >60)
Glucose, Bld: 99 mg/dL (ref 70–99)
Potassium: 4.5 mEq/L (ref 3.5–5.1)
Total Bilirubin: 0.9 mg/dL (ref 0.3–1.2)

## 2010-09-20 LAB — POCT I-STAT, CHEM 8
BUN: 14 mg/dL (ref 6–23)
BUN: 14 mg/dL (ref 6–23)
Calcium, Ion: 1.16 mmol/L (ref 1.12–1.32)
Chloride: 102 mEq/L (ref 96–112)
HCT: 30 % — ABNORMAL LOW (ref 36.0–46.0)
HCT: 35 % — ABNORMAL LOW (ref 36.0–46.0)
Hemoglobin: 10.2 g/dL — ABNORMAL LOW (ref 12.0–15.0)
Potassium: 4.4 mEq/L (ref 3.5–5.1)
Sodium: 138 mEq/L (ref 135–145)
TCO2: 18 mmol/L (ref 0–100)

## 2010-09-20 LAB — URINALYSIS, ROUTINE W REFLEX MICROSCOPIC
Bilirubin Urine: NEGATIVE
Glucose, UA: NEGATIVE mg/dL
Ketones, ur: NEGATIVE mg/dL
pH: 5.5 (ref 5.0–8.0)

## 2010-09-20 LAB — BLOOD GAS, ARTERIAL
Bicarbonate: 20.6 mEq/L (ref 20.0–24.0)
Patient temperature: 98.6
TCO2: 21.6 mmol/L (ref 0–100)
pH, Arterial: 7.417 — ABNORMAL HIGH (ref 7.350–7.400)

## 2010-09-20 LAB — URINE MICROSCOPIC-ADD ON

## 2010-09-20 LAB — HEMOGLOBIN A1C
Hgb A1c MFr Bld: 4.8 % (ref 4.6–6.1)
Mean Plasma Glucose: 91 mg/dL

## 2010-09-20 LAB — HEMOGLOBIN AND HEMATOCRIT, BLOOD
HCT: 24.8 % — ABNORMAL LOW (ref 36.0–46.0)
Hemoglobin: 8.7 g/dL — ABNORMAL LOW (ref 12.0–15.0)

## 2010-09-20 LAB — CREATININE, SERUM
Creatinine, Ser: 0.88 mg/dL (ref 0.4–1.2)
GFR calc Af Amer: >60 mL/min (ref >60)
GFR calc non Af Amer: 54 mL/min — ABNORMAL LOW (ref >60)
GFR calc non Af Amer: >60 mL/min (ref >60)

## 2010-09-20 LAB — BASIC METABOLIC PANEL
BUN: 11 mg/dL (ref 6–23)
Creatinine, Ser: 0.91 mg/dL (ref 0.4–1.2)
GFR calc non Af Amer: >60 mL/min (ref >60)
Glucose, Bld: 185 mg/dL — ABNORMAL HIGH (ref 70–99)
Potassium: 4.1 mEq/L (ref 3.5–5.1)

## 2010-09-20 LAB — APTT: aPTT: 45 seconds — ABNORMAL HIGH (ref 24–37)

## 2010-09-20 LAB — HEMOCCULT GUIAC POC 1CARD (OFFICE): Fecal Occult Bld: NEGATIVE

## 2010-09-20 LAB — MAGNESIUM
Magnesium: 2.4 mg/dL (ref 1.5–2.5)
Magnesium: 2.5 mg/dL (ref 1.5–2.5)
Magnesium: 3 mg/dL — ABNORMAL HIGH (ref 1.5–2.5)

## 2010-09-21 ENCOUNTER — Ambulatory Visit (INDEPENDENT_AMBULATORY_CARE_PROVIDER_SITE_OTHER): Payer: Medicare Other | Admitting: Nurse Practitioner

## 2010-09-21 ENCOUNTER — Encounter: Payer: Self-pay | Admitting: Nurse Practitioner

## 2010-09-21 VITALS — BP 128/72 | HR 68 | Ht 65.0 in | Wt 216.4 lb

## 2010-09-21 DIAGNOSIS — I10 Essential (primary) hypertension: Secondary | ICD-10-CM | POA: Insufficient documentation

## 2010-09-21 DIAGNOSIS — I2510 Atherosclerotic heart disease of native coronary artery without angina pectoris: Secondary | ICD-10-CM

## 2010-09-21 DIAGNOSIS — R0609 Other forms of dyspnea: Secondary | ICD-10-CM

## 2010-09-21 DIAGNOSIS — R0600 Dyspnea, unspecified: Secondary | ICD-10-CM | POA: Insufficient documentation

## 2010-09-21 DIAGNOSIS — I480 Paroxysmal atrial fibrillation: Secondary | ICD-10-CM | POA: Insufficient documentation

## 2010-09-21 DIAGNOSIS — I509 Heart failure, unspecified: Secondary | ICD-10-CM

## 2010-09-21 DIAGNOSIS — R0602 Shortness of breath: Secondary | ICD-10-CM

## 2010-09-21 DIAGNOSIS — Z7901 Long term (current) use of anticoagulants: Secondary | ICD-10-CM

## 2010-09-21 DIAGNOSIS — I4891 Unspecified atrial fibrillation: Secondary | ICD-10-CM

## 2010-09-21 LAB — CBC
HCT: 27.1 % — ABNORMAL LOW (ref 36.0–46.0)
HCT: 32.9 % — ABNORMAL LOW (ref 36.0–46.0)
Hemoglobin: 11.1 g/dL — ABNORMAL LOW (ref 12.0–15.0)
Hemoglobin: 8.9 g/dL — ABNORMAL LOW (ref 12.0–15.0)
MCHC: 34.2 g/dL (ref 30.0–36.0)
MCV: 88.4 fL (ref 78.0–100.0)
MCV: 88.6 fL (ref 78.0–100.0)
Platelets: 173 10*3/uL (ref 150–400)
RBC: 3 MIL/uL — ABNORMAL LOW (ref 3.87–5.11)
RDW: 16.5 % — ABNORMAL HIGH (ref 11.5–15.5)
RDW: 16.7 % — ABNORMAL HIGH (ref 11.5–15.5)
RDW: 16.9 % — ABNORMAL HIGH (ref 11.5–15.5)

## 2010-09-21 LAB — BASIC METABOLIC PANEL
BUN: 17 mg/dL (ref 6–23)
CO2: 25 mEq/L (ref 19–32)
Calcium: 8.1 mg/dL — ABNORMAL LOW (ref 8.4–10.5)
Calcium: 9 mg/dL (ref 8.4–10.5)
Chloride: 102 mEq/L (ref 96–112)
Creatinine, Ser: 0.9 mg/dL (ref 0.4–1.2)
Creatinine, Ser: 1.1 mg/dL (ref 0.4–1.2)
GFR calc Af Amer: >60 mL/min (ref >60)
GFR calc non Af Amer: >60 mL/min (ref >60)
GFR: 54.69 mL/min — ABNORMAL LOW (ref >60.00)
Glucose, Bld: 70 mg/dL (ref 70–99)
Potassium: 4.3 mEq/L (ref 3.5–5.1)
Sodium: 138 mEq/L (ref 135–145)
Sodium: 136 mEq/L (ref 135–145)

## 2010-09-21 LAB — GLUCOSE, CAPILLARY
Glucose-Capillary: 104 mg/dL — ABNORMAL HIGH (ref 70–99)
Glucose-Capillary: 124 mg/dL — ABNORMAL HIGH (ref 70–99)
Glucose-Capillary: 131 mg/dL — ABNORMAL HIGH (ref 70–99)
Glucose-Capillary: 83 mg/dL (ref 70–99)
Glucose-Capillary: 86 mg/dL (ref 70–99)
Glucose-Capillary: 89 mg/dL (ref 70–99)
Glucose-Capillary: 91 mg/dL (ref 70–99)
Glucose-Capillary: 95 mg/dL (ref 70–99)
Glucose-Capillary: 96 mg/dL (ref 70–99)
Glucose-Capillary: 99 mg/dL (ref 70–99)

## 2010-09-21 LAB — URINALYSIS, ROUTINE W REFLEX MICROSCOPIC
Bilirubin Urine: NEGATIVE
Ketones, ur: NEGATIVE mg/dL
Nitrite: NEGATIVE
Urobilinogen, UA: 1 mg/dL (ref 0.0–1.0)

## 2010-09-21 LAB — CBC WITH DIFFERENTIAL/PLATELET
Basophils Absolute: 0.1 10*3/uL (ref 0.0–0.1)
Basophils Relative: 0.9 % (ref 0.0–3.0)
Eosinophils Absolute: 0.3 10*3/uL (ref 0.0–0.7)
Eosinophils Relative: 3.3 % (ref 0.0–5.0)
HCT: 36.6 % (ref 36.0–46.0)
Hemoglobin: 12 g/dL (ref 12.0–15.0)
Lymphocytes Relative: 26.2 % (ref 12.0–46.0)
Lymphs Abs: 2.1 10*3/uL (ref 0.7–4.0)
MCHC: 32.9 g/dL (ref 30.0–36.0)
MCV: 80.2 fl (ref 78.0–100.0)
Monocytes Absolute: 0.4 10*3/uL (ref 0.1–1.0)
Monocytes Relative: 5.3 % (ref 3.0–12.0)
Neutro Abs: 5.3 10*3/uL (ref 1.4–7.7)
Neutrophils Relative %: 64.3 % (ref 43.0–77.0)
Platelets: 302 10*3/uL (ref 150.0–400.0)
RBC: 4.56 Mil/uL (ref 3.87–5.11)
RDW: 15.3 % — ABNORMAL HIGH (ref 11.5–14.6)
WBC: 8.2 10*3/uL (ref 4.5–10.5)

## 2010-09-21 LAB — COMPREHENSIVE METABOLIC PANEL
Alkaline Phosphatase: 72 U/L (ref 39–117)
BUN: 19 mg/dL (ref 6–23)
Creatinine, Ser: 0.91 mg/dL (ref 0.4–1.2)
Glucose, Bld: 201 mg/dL — ABNORMAL HIGH (ref 70–99)
Potassium: 4.1 mEq/L (ref 3.5–5.1)
Total Bilirubin: 0.8 mg/dL (ref 0.3–1.2)
Total Protein: 6.6 g/dL (ref 6.0–8.3)

## 2010-09-21 LAB — POCT I-STAT 3, VENOUS BLOOD GAS (G3P V)
Acid-base deficit: 5 mmol/L — ABNORMAL HIGH (ref 0.0–2.0)
O2 Saturation: 69 %
TCO2: 21 mmol/L (ref 0–100)
pCO2, Ven: 33.3 mmHg — ABNORMAL LOW (ref 45.0–50.0)

## 2010-09-21 LAB — IRON AND TIBC
Saturation Ratios: 15 % — ABNORMAL LOW (ref 20–55)
TIBC: 385 ug/dL (ref 250–470)

## 2010-09-21 LAB — BRAIN NATRIURETIC PEPTIDE
Pro B Natriuretic peptide (BNP): 144 pg/mL — ABNORMAL HIGH (ref 0.0–100.0)
Pro B Natriuretic peptide (BNP): 177.2 pg/mL — ABNORMAL HIGH (ref 0.0–100.0)

## 2010-09-21 LAB — POCT I-STAT 3, ART BLOOD GAS (G3+)
pCO2 arterial: 30.9 mmHg — ABNORMAL LOW (ref 35.0–45.0)
pH, Arterial: 7.411 — ABNORMAL HIGH (ref 7.350–7.400)

## 2010-09-21 LAB — DIFFERENTIAL
Basophils Absolute: 0 10*3/uL (ref 0.0–0.1)
Basophils Relative: 1 % (ref 0–1)
Neutro Abs: 4 10*3/uL (ref 1.7–7.7)
Neutrophils Relative %: 69 % (ref 43–77)

## 2010-09-21 LAB — VITAMIN B12: Vitamin B-12: 179 pg/mL — ABNORMAL LOW (ref 211–911)

## 2010-09-21 LAB — RETICULOCYTES
Retic Count, Absolute: 57.1 10*3/uL (ref 19.0–186.0)
Retic Ct Pct: 1.8 % (ref 0.4–3.1)

## 2010-09-21 LAB — URINE MICROSCOPIC-ADD ON

## 2010-09-21 LAB — HEMOGLOBIN A1C: Mean Plasma Glucose: 111 mg/dL

## 2010-09-21 LAB — CARDIAC PANEL(CRET KIN+CKTOT+MB+TROPI): CK, MB: 1.4 ng/mL (ref 0.3–4.0)

## 2010-09-21 LAB — FOLATE: Folate: 15.8 ng/mL

## 2010-09-21 LAB — LIPID PANEL: Cholesterol: 119 mg/dL (ref 0–200)

## 2010-09-21 LAB — APTT: aPTT: 28 seconds (ref 24–37)

## 2010-09-21 LAB — HEPARIN LEVEL (UNFRACTIONATED): Heparin Unfractionated: <0.1 IU/mL — ABNORMAL LOW (ref 0.30–0.70)

## 2010-09-21 LAB — PROTIME-INR: INR: 1 (ref 0.00–1.49)

## 2010-09-21 MED ORDER — FUROSEMIDE 20 MG PO TABS: 20.0000 mg | ORAL_TABLET | Freq: Every day | ORAL | Status: DC

## 2010-09-21 NOTE — Assessment & Plan Note (Signed)
She has had some recurrent chest pain. She says it was different from her prior chest pain syndrome. She will let us know if she has recurrence and will use her NTG sl.

## 2010-09-21 NOTE — Assessment & Plan Note (Signed)
Blood pressure is much better. She may stay off the HCTZ. She is to continue to monitor at home.

## 2010-09-21 NOTE — Assessment & Plan Note (Addendum)
She has not been taking her Lasix. I have restarted it. She will take the 20mg  tablet every day. We are going to check an echo and labs today. I think we need to look at her LV function.  I have ordered chemistries, CBC and BNP level. Further disposition to follow.

## 2010-09-21 NOTE — Progress Notes (Signed)
History of Present Illness: Amber [name] is seen back today for a follow up visit. She is seen for Dr. Patty Sermons. She is not feeling as good as she would like to be. She has gotten more short of breath. Her weight is continuing to go up. She had some midsternal chest pain about 2 weeks ago. It felt different from her pain that she had with her MI last September. It tended to come and go and she thought it was indigestion. She did not try any NTG. Her blood pressure at home has been very good. She is not able to take the HCTZ. It makes her feel "sick". She has not been using her Lasix. She thinks her rhythm has been ok. She continues to bruise a lot. She remains nervous about her cardiac condition. She continues with PT for her shoulder.  Current Outpatient Prescriptions on File Prior to Visit  Medication Sig Dispense Refill  . amLODipine (NORVASC) 5 MG tablet Take 1 tablet (5 mg total) by mouth daily.  30 tablet    . aspirin 81 MG tablet Take 81 mg by mouth daily.        Marland Kitchen atorvastatin (LIPITOR) 40 MG tablet Take 40 mg by mouth daily.        . clopidogrel (PLAVIX) 75 MG tablet Take 75 mg by mouth daily.        . Cyanocobalamin (VITAMIN B-12 IJ) Inject 1,000 mcg as directed every 30 (thirty) days.        Marland Kitchen docusate sodium (COLACE) 100 MG capsule Take 1 capsule (100 mg total) by mouth daily.  10 capsule  0  . famotidine (PEPCID) 20 MG tablet Take 20 mg by mouth 2 (two) times daily.        Marland Kitchen losartan (COZAAR) 100 MG tablet Take 100 mg by mouth daily.        . metoprolol tartrate (LOPRESSOR) 12.5 mg TABS Take 12.5 mg by mouth 2 (two) times daily.        . raloxifene (EVISTA) 60 MG tablet Take 60 mg by mouth daily.        . hydrochlorothiazide (,MICROZIDE/HYDRODIURIL,) 12.5 MG capsule Take 1 capsule (12.5 mg total) by mouth once a week.  4 capsule    . DISCONTD: furosemide (LASIX) 20 MG tablet Take 20 mg by mouth daily as needed.          Allergies  Allergen Reactions  . Metformin     REACTION: Reaction not  known    Past Medical History  Diagnosis Date  . CAD (coronary artery disease) July 2010    s/p CABG  . S/P AVR (aortic valve replacement) July 2010  . 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 PAF (paroxysmal atrial fibrillation)   . Iron deficiency anemia   . HTN (hypertension)   . Diabetes mellitus type 2, diet-controlled   . Hyperlipidemia   . DJD (degenerative joint disease)   . Carotid artery disease   . GERD (gastroesophageal reflux disease)   . Esophagitis     s/p esophageal dilatation  . Pernicious anemia     on B12 injections  . Chronic anticoagulation   . Shoulder fracture, left September 2011    Past Surgical History  Procedure Date  . Coronary artery bypass graft 12/2008  . Aortic valve replacement 12/2008  . Cardiac catheterization 02/2010  . Aspiration thrombectomy 07/2009    History  Smoking status  . Never Smoker   Smokeless tobacco  .  Never Used    History  Alcohol Use No    Family History  Problem Relation Age of Onset  . Stroke Mother   . Stroke Father     Review of Systems: The review of systems is positive for some dry cough. No swelling. She is more short of breath. She is not dizzy. She has not had fever or chills. She is bruising.  All other systems were reviewed and are negative.  Physical Exam: BP 128/72  Pulse 68  Ht 5\' 5"  (1.651 m)  Wt 216 lb 6.4 oz (98.158 kg)  BMI 36.01 kg/m2 Patient is very pleasant and in no acute distress. She is obese. Skin is warm and dry. Color is normal.  HEENT is unremarkable. Normocephalic/atraumatic. PERRL. Sclera are nonicteric. Neck is supple. No masses. No JVD. Lungs show some crackles in the bases with generalized coarseness. Cardiac exam shows a regular rate and rhythm today. Abdomen is soft. Extremities are without edema. Gait and ROM are intact. No gross neurologic deficits noted. She has several bruises on her arms.   LABORATORY DATA:    PENDING  Assessment /  Plan:

## 2010-09-21 NOTE — Patient Instructions (Signed)
We are going to check an ultrasound of your heart. We are going to check some labs today. We are going to stop the HCTZ. I want you to take the Lasix every day. I will have you see Dr. Patty Sermons after your ultrasound to discuss the results. If you have more episodes chest pain, take your nitroglycerin and get back in touch with [place].

## 2010-09-21 NOTE — Assessment & Plan Note (Signed)
No recurrence. 

## 2010-09-22 ENCOUNTER — Other Ambulatory Visit: Payer: Self-pay | Admitting: *Deleted

## 2010-09-22 DIAGNOSIS — I10 Essential (primary) hypertension: Secondary | ICD-10-CM

## 2010-09-22 MED ORDER — LOSARTAN POTASSIUM 100 MG PO TABS: 100.0000 mg | ORAL_TABLET | Freq: Every day | ORAL | Status: DC

## 2010-09-23 ENCOUNTER — Other Ambulatory Visit: Payer: Self-pay | Admitting: *Deleted

## 2010-09-25 ENCOUNTER — Telehealth: Payer: Self-pay | Admitting: *Deleted

## 2010-09-25 NOTE — Telephone Encounter (Signed)
Adv. Patient of lab results 

## 2010-09-28 ENCOUNTER — Ambulatory Visit (HOSPITAL_COMMUNITY): Payer: Medicare Other | Attending: Cardiology | Admitting: Radiology

## 2010-09-28 DIAGNOSIS — R0989 Other specified symptoms and signs involving the circulatory and respiratory systems: Secondary | ICD-10-CM | POA: Insufficient documentation

## 2010-09-28 DIAGNOSIS — R0600 Dyspnea, unspecified: Secondary | ICD-10-CM

## 2010-09-28 DIAGNOSIS — R0609 Other forms of dyspnea: Secondary | ICD-10-CM | POA: Insufficient documentation

## 2010-09-28 DIAGNOSIS — I2510 Atherosclerotic heart disease of native coronary artery without angina pectoris: Secondary | ICD-10-CM | POA: Insufficient documentation

## 2010-10-01 ENCOUNTER — Telehealth: Payer: Self-pay | Admitting: Cardiology

## 2010-10-01 DIAGNOSIS — R0602 Shortness of breath: Secondary | ICD-10-CM

## 2010-10-01 NOTE — Progress Notes (Signed)
Left message

## 2010-10-01 NOTE — Telephone Encounter (Signed)
Reported echo, but patient stated she was still having hard time catching breath and coughing (non productive).  Is taking lasix.  Patient wondering if she should get chest xray.  Please advise

## 2010-10-01 NOTE — Telephone Encounter (Signed)
Returned phone call. Please call back on her cell phone.

## 2010-10-02 ENCOUNTER — Ambulatory Visit
Admission: RE | Admit: 2010-10-02 | Discharge: 2010-10-02 | Disposition: A | Discharge status: Home / Self Care | Payer: Medicare Other | Source: Ambulatory Visit | Attending: Cardiology | Admitting: Cardiology

## 2010-10-02 DIAGNOSIS — R0602 Shortness of breath: Secondary | ICD-10-CM

## 2010-10-02 NOTE — Telephone Encounter (Signed)
Yes get a chest x-ray.

## 2010-10-02 NOTE — Telephone Encounter (Signed)
Patient going for xray today

## 2010-10-05 ENCOUNTER — Telehealth: Payer: Self-pay | Admitting: *Deleted

## 2010-10-05 DIAGNOSIS — Z79899 Other long term (current) drug therapy: Secondary | ICD-10-CM

## 2010-10-05 DIAGNOSIS — R0602 Shortness of breath: Secondary | ICD-10-CM

## 2010-10-05 NOTE — Telephone Encounter (Signed)
Message copied by Regis Bill on Mon Oct 05, 2010  3:41 PM ------      Message from: Norma Fredrickson      Created: Tue Sep 29, 2010 10:19 AM       Ok to report. Echo is satisfactory. Has normal EF. Does have diastolic dysfunction. Lasix restarted at OV. Please show to Dr. Patty Sermons as well.

## 2010-10-05 NOTE — Telephone Encounter (Signed)
Advised of chest xray 

## 2010-10-05 NOTE — Telephone Encounter (Signed)
Message copied by Regis Bill on Mon Oct 05, 2010  3:47 PM ------      Message from: Cassell Clement      Created: Mon Oct 05, 2010  9:43 AM       Please report.  The chest x-ray was stable.  The heart size was normal.  There was no sign of congestive heart failure

## 2010-10-05 NOTE — Telephone Encounter (Signed)
Advised patient last week of echo, still shortness of breath so scheduled xray. Results of xray give today

## 2010-10-06 NOTE — Telephone Encounter (Signed)
Still having shortness of breath, advised to increase lasix 20 mg to 2 in am and check labs at ov next week

## 2010-10-06 NOTE — Telephone Encounter (Signed)
In crease Lasix 20 to two tabs each a.m. And see if any improvement.  Recheck BNP and BMET at next OV.

## 2010-10-09 ENCOUNTER — Encounter: Payer: Self-pay | Admitting: *Deleted

## 2010-10-14 ENCOUNTER — Ambulatory Visit (INDEPENDENT_AMBULATORY_CARE_PROVIDER_SITE_OTHER): Payer: Medicare Other | Admitting: Cardiology

## 2010-10-14 ENCOUNTER — Other Ambulatory Visit (INDEPENDENT_AMBULATORY_CARE_PROVIDER_SITE_OTHER): Payer: Medicare Other | Admitting: *Deleted

## 2010-10-14 ENCOUNTER — Encounter: Payer: Self-pay | Admitting: Cardiology

## 2010-10-14 DIAGNOSIS — Z951 Presence of aortocoronary bypass graft: Secondary | ICD-10-CM | POA: Insufficient documentation

## 2010-10-14 DIAGNOSIS — I4891 Unspecified atrial fibrillation: Secondary | ICD-10-CM

## 2010-10-14 DIAGNOSIS — I10 Essential (primary) hypertension: Secondary | ICD-10-CM

## 2010-10-14 DIAGNOSIS — R0600 Dyspnea, unspecified: Secondary | ICD-10-CM

## 2010-10-14 DIAGNOSIS — Z953 Presence of xenogenic heart valve: Secondary | ICD-10-CM

## 2010-10-14 DIAGNOSIS — I480 Paroxysmal atrial fibrillation: Secondary | ICD-10-CM

## 2010-10-14 DIAGNOSIS — Z79899 Other long term (current) drug therapy: Secondary | ICD-10-CM

## 2010-10-14 DIAGNOSIS — R0609 Other forms of dyspnea: Secondary | ICD-10-CM

## 2010-10-14 DIAGNOSIS — R0602 Shortness of breath: Secondary | ICD-10-CM

## 2010-10-14 DIAGNOSIS — Z9889 Other specified postprocedural states: Secondary | ICD-10-CM

## 2010-10-14 DIAGNOSIS — Z954 Presence of other heart-valve replacement: Secondary | ICD-10-CM

## 2010-10-14 LAB — BASIC METABOLIC PANEL
CO2: 25 mEq/L (ref 19–32)
Calcium: 9.6 mg/dL (ref 8.4–10.5)
Chloride: 104 mEq/L (ref 96–112)
Glucose, Bld: 107 mg/dL — ABNORMAL HIGH (ref 70–99)
Potassium: 4.6 mEq/L (ref 3.5–5.1)
Sodium: 138 mEq/L (ref 135–145)

## 2010-10-14 NOTE — Assessment & Plan Note (Signed)
The patient has had no further episodes of atrial fibrillationAnd she is no longer on Pradaxa.  She does take a baby aspirin daily.

## 2010-10-14 NOTE — Assessment & Plan Note (Signed)
The patient has a past history of coronary disease and aortic stenosis.  On 01/10/09 she underwent coronary artery bypass graft surgery and aortic valve replacement using a bioprosthetic valve..  Postoperatively she has done well but continues to complain of dyspnea and easy fatigue.  In view of this we did a recent echocardiogram which showed good prosthetic valve function and normal left ventricular ejection fraction and will also get a chest x-ray which showed normal heart size and clear lungs.  We are checking a B. Natruretic peptide today results pending.  She's not having any orthopnea or paroxysmal nocturnal dyspnea.  Mainly She complains of shortness of breath with exertion and easy fatigue.She has not had any evidence of anemia and she had a recent CBC which was normal.

## 2010-10-14 NOTE — Assessment & Plan Note (Signed)
The patient feels that the losartan may be contributing to her feelings of fatigue and malaise.  She also has a slight cough.  She was unable to tolerate lisinopril and we will stop her losartan also/

## 2010-10-14 NOTE — Progress Notes (Signed)
Amber [name] Date of Birth:  04-07-1936 [facility] [address]   [address] [place], [place]  [address] [phone]           Fax   [phone]  History of Present Illness: This pleasant 75 year old woman is seen for a scheduled followup office visit.  She has a past history of coronary artery disease and aortic stenosis and underwent aortic valve replacement and coronary artery bypass graft surgery on January 10, 2009 by Dr. Tressie Stalker.  She had some initial problems with atrial fibrillation but more recently has been back in normal sinus rhythm.  She is off Pradaxa and is on daily aspirin.  She's not having any TIA symptoms.  She's not having any chest pain or angina but does have exertional dyspnea and easy fatigue.  Current Outpatient Prescriptions  Medication Sig Dispense Refill  . amLODipine (NORVASC) 5 MG tablet Take 1 tablet (5 mg total) by mouth daily.  30 tablet    . aspirin 81 MG tablet Take 81 mg by mouth daily.        Marland Kitchen atorvastatin (LIPITOR) 40 MG tablet Take 40 mg by mouth daily.        . clopidogrel (PLAVIX) 75 MG tablet Take 75 mg by mouth daily.        . Cyanocobalamin (VITAMIN B-12 IJ) Inject 1,000 mcg as directed every 30 (thirty) days.        Marland Kitchen docusate sodium (COLACE) 100 MG capsule Take 1 capsule (100 mg total) by mouth daily.  10 capsule  0  . famotidine (PEPCID) 20 MG tablet Take 20 mg by mouth 2 (two) times daily.        . furosemide (LASIX) 20 MG tablet Take 40 mg by mouth daily.        . metoprolol tartrate (LOPRESSOR) 12.5 mg TABS Take 12.5 mg by mouth 2 (two) times daily.        . nitroGLYCERIN (NITROSTAT) 0.4 MG SL tablet Place 0.4 mg under the tongue every 5 (five) minutes as needed.        . raloxifene (EVISTA) 60 MG tablet Take 60 mg by mouth daily.        Marland Kitchen DISCONTD: furosemide (LASIX) 20 MG tablet Take 1 tablet (20 mg total) by mouth daily.  30 tablet  6  . DISCONTD: losartan (COZAAR) 100 MG tablet Take 1 tablet (100 mg  total) by mouth daily.  30 tablet  5  . hydrochlorothiazide (,MICROZIDE/HYDRODIURIL,) 12.5 MG capsule Take 12.5 mg by mouth daily.          Allergies  Allergen Reactions  . Lisinopril Cough  . Metformin     nausea    Patient Active Problem List  Diagnoses  . CAD (coronary artery disease)  . PAF (paroxysmal atrial fibrillation)  . HTN (hypertension)  . Chronic anticoagulation  . Dyspnea  . Hx of CABG  . S/P aortic valve replacement with bioprosthetic valve    History  Smoking status  . Never Smoker   Smokeless tobacco  . Never Used    History  Alcohol Use No    Family History  Problem Relation Age of Onset  . Stroke Mother   . Stroke Father     Review of Systems: Constitutional: no fever chills diaphoresis  or change in weight.  Head and neck: no hearing loss, no epistaxis, no photophobia or visual disturbance. Respiratory: No cough, shortness of breath or wheezing. Cardiovascular: No chest pain peripheral  edema, palpitations. Gastrointestinal: No abdominal distention, no abdominal pain, no change in bowel habits hematochezia or melena. Genitourinary: No dysuria, no frequency, no urgency, no nocturia. Musculoskeletal:No arthralgias, no back pain, no gait disturbance or myalgias. Neurological: No dizziness, no headaches, no numbness, no seizures, no syncope, no weakness, no tremors. Hematologic: No lymphadenopathy, no easy bruising. Psychiatric: No confusion, no hallucinations, no sleep disturbance.    Physical Exam: Filed Vitals:   10/14/10 1012  BP: 130/76  Pulse: 66  The general appearance feels a well-developed well-nourished woman in no distress.  Skin is clear.Pupils equal and reactive.   Extraocular Movements are full.  There is no scleral icterus.  The mouth and pharynx are normal.  The neck is supple.  The carotids reveal no bruits.  The jugular venous pressure is normal.  The thyroid is not enlarged.  There is no lymphadenopathy.The chest is clear to  percussion and auscultation. There are no rales or rhonchi. Expansion of the chest is symmetrical.  The heart reveals a soft systolic ejection murmur across the prosthetic aortic valve.  No diastolic murmur.  No gallop or rubThe abdomen is soft and nontender. Bowel sounds are normal. The liver and spleen are not enlarged. There Are no abdominal masses. There are no bruits.The pedal pulses are good.  There is no phlebitis or edema.  There is no cyanosis or clubbing.Strength is normal and symmetrical in all extremities.  There is no lateralizing weakness.  There are no sensory deficits.No muscle weakness or atrophy.  No muscle tenderness.The skin is warm and dry.  There is no rash.There are no overt psychiatric signs or symptoms.   Assessment / Plan: We reviewed the fact that recent objective testing including chest x-ray and echocardiogram showed that her aortic valve is working fine.  Some of her exertional dyspnea and fatigue maybe deconditioning and have encouraged her to reapply to the cardiac rehabilitation program in [place].  She was unable to finish the course previously because of her shoulder injury.  We are stopping her losartan since she feels that that is adversely affecting her symptoms.  Continue other medicines the same and be rechecked in several months.

## 2010-10-27 ENCOUNTER — Encounter: Payer: PPO | Admitting: Cardiology

## 2010-10-27 NOTE — Discharge Summary (Signed)
Amber [name], HANKE                  ACCOUNT NO.:  [id number]   MEDICAL RECORD NO.:  [id number]          PATIENT TYPE:  INP   LOCATION:  2041                         FACILITY:  [facility]   PHYSICIAN:  Salvatore Decent. [name], M.D. DATE OF BIRTH:  [birth year]   DATE OF ADMISSION:  01/10/2009  DATE OF DISCHARGE:                               DISCHARGE SUMMARY   HISTORY:  The patient is a 75 year old white female from [place] with  a longstanding history of a heart murmur and aortic stenosis.  She has  been followed by her primary care physician, Dr. Nicholos Johns until recently.  She admits that in retrospect she has had progressive symptoms of  exertional shortness of breath for several years.  This has continued to  gradually worsened and over the last year, she has developed fairly  significant exercise intolerance.  She now gets short of breath with  minimal physical activity.  She reports 2-3-pillow orthopnea.  She  reports regular episodes of shortness of breath, waking her from her  sleep at night causing her to sit up and catch her breath.  She has had  intermittent problems with bilateral lower extremity edema.  She has  also developed intermittent episodes of dizziness, although she has had  no frank episodes of syncope.  Over the last several months, she has  began to develop substernal chest tightness and pressure.  The symptoms  can come and go sporadically and were noted to be not necessarily  associated with physical activity.  She was ultimately referred to Dr.  Patty Sermons and 2-D echocardiogram was performed on December 03, 2008 at  [facility].  This demonstrated the presence of  severe aortic stenosis with mild aortic regurgitation.  The peak  velocity across the valve was measured in excess of 4 meters per second  corresponding to peak and mean transvalvular gradients estimated at 65  and 38 mmHg respectively.  Aortic valve area was estimated at 0.69  centimeter  squared by the continuity equation.  There was mild left  ventricular hypertrophy with normal left ventricular systolic function.  There was mild-to-moderate mitral regurgitation as well as mild  tricuspid regurgitation and evidence of mild pulmonary hypertension.  The patient subsequently underwent a stress test to further evaluate her  chest pain.  She was in normal sinus rhythm at the onset of the stress  test, but exercise only 4 minutes and 38 seconds of stage I of the Bruce  protocol.  During the recovery phase, she went into atrial fibrillation  and persisted in this for several hours.  She was admitted to the  hospital and then underwent a left and right heart catheterization the  following day on December 10, 2008.  This confirmed the presence of severe  aortic stenosis.  Left ventricular systolic function remained normal.  Baseline cardiac output was normal.  Mean transvalvular gradient across  the aortic valve was 32 mmHg.  Pulmonary artery pressures were slightly  elevated at 37/17 and pulmonary capillary wedge pressure was 14.  Coronary arteriography revealed an acentric 70% stenosis of the LAD  coronary artery.  There was no other significant coronary artery  disease.  Postcatheterization, the patient developed a groin hematoma  and acute blood loss anemia, which stabilized after the discontinuation  of Plavix and IV heparin therapy.  The patient was discharged on December 13, 2008 and referred to Tressie Stalker, MD for surgical consultation.  Dr.  [name] evaluated the patient and her studies and agreed for  recommendations to proceed with aortic valve replacement as well as  coronary artery bypass grafting.  She was admitted this hospitalization  for the procedure.   PAST MEDICAL HISTORY:  1. Aortic stenosis.  2. Hypertension.  3. Type 2 diabetes mellitus.  4. Hyperlipidemia.  5. Obesity.  6. GE reflux with hiatal hernia, status post laparoscopic Nissen      fundoplication.  7.  Degenerative arthritis.  8. Asymptomatic carotid stenosis.  9. Osteopenia.  10.Iron deficiency anemia and vitamin B12 deficiency.  11.Paroxysmal atrial fibrillation.   PAST SURGICAL HISTORY:  Hiatal hernia repair and Nissen fundoplication  as described, also previous laparoscopic cholecystectomy and previous  abdominal hysterectomy.   FAMILY HISTORY:  The patient's daughter had a sudden myocardial  infarction as well as several brothers who have premature coronary  artery disease.   SOCIAL HISTORY:  The patient has been widowed for 7 years.  She is a  nonsmoker and denies alcohol consumption.   MEDICATIONS PRIOR TO ADMISSION:  1. Metoprolol 12.5 twice daily.  2. Vytorin 10/80 one half tablet daily.  3. Evista 60 mg daily.  4. Micardis 20 mg daily.  5. Colace 200 mg daily.  6. Iron sulfate 325 mg three times daily.  7. Aspirin 81 mg daily.  8. Vitamin B12 1000 mg monthly.  9. Ambien 5 mg at bedtime for sleep as needed.  10.Sublingual nitroglycerin as needed.  11.Lasix 20 mg as needed.  12.Actos 15 mg daily.   ALLERGIES:  No known.   PHYSICAL EXAMINATION:  Please see the history and physical done at the  time of admission.   HOSPITAL COURSE:  The patient was admitted electively and on January 10, 2009, underwent coronary artery bypass grafting x1 with left internal  mammary artery to the LAD.  Aortic valve replacement with a 23-mm tissue  valve.  The patient tolerated the procedure well, and was taken to the  surgical intensive care unit in stable condition.  Postoperative  hospital course, the patient has overall progressed nicely.  She has  maintained stable hemodynamics and all routine lines, monitors, drainage  devices have been discontinued in the standard fashion.  The patient has  had postoperative atrial fibrillation.  This has been intermittent.  She  was treated with intravenous amiodarone and then changed to oral  currently, she is in a normal sinus rhythm.   Neurologically, she has  remained intact.  She does have a mild acute blood loss anemia.  The  values are still quite stable.  Her most recent hemoglobin and  hematocrit dated January 14, 2009 are 10.5 and 31.2 respectively.  Her  incisions are healing well.  Oxygen has been weaned.  She is tolerating  routine cardiac rehab modalities.  She has been started on Coumadin and  most recent INR dated January 15, 2009 is 1.5.  Currently, her status is  felt to be tentatively stable for discharge in the morning of January 16, 2009, pending morning round reevaluation.   Medications at discharge at the time of this dictation include the  following:  1.  Amiodarone 400 mg twice daily for 7 days, then 400 mg once daily.  2. Azitamide 10 mg daily.  3. Oxycodone 5 mg one to two tablets q.4-6 h. as needed.  4. Simvastatin 80 mg daily.  5. Coumadin 2.5 mg daily and as directed.  6. She is to continue her Actos 15 mg daily.  7. Aspirin 81 mg daily.  8. Vitamin B12 injection q. Monthly.  9. Docusate 100 mg daily.  10.Lopressor 25 mg twice daily.  11.Telmisartan 20 mg daily.   INSTRUCTIONS:  The patient will receive written instructions in regard  to medications, activity, diet, wound care, and followup.   FOLLOWUP:  Dr. Patty Sermons in 2 weeks.  Additionally, she will have INR  check through Dr. Yevonne Pax office for management of Coumadin.  She is  instructed to get her first INR on January 20, 2009.  Additionally, she  will see Dr. [name] on February 10, 2009 at 12 noon.   FINAL DIAGNOSIS:  Severe coronary artery disease, single-vessel as  described.   OTHER DIAGNOSES:  1. Severe aortic stenosis as described.  2. History of hypertension.  3. Diabetes mellitus, type 2.  4. Hyperlipidemia.  5. Obesity.  6. Gastroesophageal reflux.  7. Degenerative arthritis.  8. Asymptomatic carotid stenosis.  9. Osteopenia.  10.History of iron deficiency and B12 deficiency anemia.  11.History of atrial fibrillation  preoperatively and postoperatively,      paroxysmal.  12.Postoperative acute blood loss anemia.  13.History of Nissen fundoplication.  14.History of cholecystectomy.  15.History of abdominal hysterectomy.      Rowe Clack, P.A.-C.      Salvatore Decent. [name], M.D.  Electronically Signed    WEG/MEDQ  D:  01/15/2009  T:  01/15/2009  Job:  [id number]   cc:   Cassell Clement, M.D.  Robert A. Nicholos Johns, M.D.

## 2010-10-27 NOTE — Assessment & Plan Note (Signed)
OFFICE VISIT   RACINE, ERBY  DOB:  08-29-1935                                        February 10, 2009  CHART #:  [id number]   HISTORY OF PRESENT ILLNESS:  The patient returns to the office today for  routine followup status post aortic valve replacement and coronary  artery bypass grafting x1 on January 10, 2009, for severe aortic stenosis  with single-vessel coronary artery disease.  The patient's early  postoperative recovery while she remained in the hospital was notable  for postoperative atrial fibrillation that was treated with oral  amiodarone.  She was discharged home on Coumadin.  Following hospital  discharge, she has done well, and she has been followed carefully by Dr.  Patty Sermons at [facility].  She has been maintaining  sinus rhythm.  Her Coumadin dose has been monitored and adjusted as  necessary.  She has done remarkably well overall, and she returns to our  office for routine followup.  She states that she is now getting along  much better than she ever was prior to surgery.  Specifically, she is  walking without  anymore shortness of breath that she had prior to  surgery.  She has very mild residual soreness in her chest and  sensitivity along the left anterior chest wall.  She has not had any  tachy palpitations.  She has not had any breathing difficulties.  Her  appetite is good.  She is sleeping well at night, now that she has been  given a prescription for Xanax by Dr. Patty Sermons due to some anxiety that  she had had experienced previously.  Overall, she has no complaints and  is getting along nicely.   PHYSICAL EXAMINATION:  General:  Notable for well-appearing female.  Vital Signs:  Blood pressure 121/66, pulse 63 regular, oxygen saturation  98% on room air.  Chest:  Median sternotomy incision that is healing  nicely.  The sternum is stable on palpation.  Breath sounds are clear to  auscultation and symmetrical  bilaterally.  No wheezes or rhonchi noted.  Cardiovascular:  Regular rate and rhythm.  I can barely appreciate a  soft systolic murmur along the LV outflow tract.  Otherwise, no murmurs,  rubs, or gallops noted.  Abdomen:  Soft, nontender.  Extremities:  Warm  and well perfused.  There is no lower extremity edema.   DIAGNOSTIC TEST:  Chest x-ray performed today at the [facility] is reviewed.  This demonstrates clear lung fields bilaterally.  There is small residual left pleural effusion, decreased in comparison  with her last previous x-ray.  Lung fields are otherwise clear.  All the  sternal wires appear intact.   IMPRESSION:  Excellent progress following recent aortic valve  replacement and coronary artery bypass grafting.  Amber [name] appears to  maintaining sinus rhythm, and her exercise tolerance is already quite  good.   PLAN:  I have instructed Amber [name] to continue to gradually increase her  physical activity as tolerated with her only limitation at this point  remaining that she refrain from heavy lifting or strenuous use of her  arms or shoulders for at least another 2 months.  I have instructed her  to cut her dose of amiodarone back to 200 mg daily.  I think she could  stop amiodarone  entirely when her current prescription runs out.  If she  remains in sinus rhythm, she could probably come off Coumadin in  approximately 2 months.  I have encouraged her to get involved in the  cardiac rehab program.  I think she is ready to go ahead and get  started.  I also think is reasonable for her to return to driving an  automobile, although I have suggested that she start by driving short  distances only during daylight hours and gradually increase from there.  All of her questions have been addressed.  We will plan to see her back  in 2 months for further followup.   Salvatore Decent. [name], M.D.  Electronically Signed   CHO/MEDQ  D:  02/10/2009  T:  02/11/2009  Job:   [id number]   cc:   Cassell Clement, M.D.  Peter M. Swaziland, M.D.  Robert A. Nicholos Johns, M.D.

## 2010-10-27 NOTE — H&P (Signed)
HISTORY AND PHYSICAL EXAMINATION   December 30, 2008   Re:  AMANDEEP, HOGSTON            DOB:  1936/02/13   DATE OF PLANNED HOSPITAL ADMISSION AND SURGERY:  January 08, 2009.   REASON FOR CONSULTATION:  Severe aortic stenosis.   HISTORY OF PRESENT ILLNESS:  The patient is a 75 year old widowed white  female from [place], who has had longstanding history of heart murmur  and aortic stenosis.  She has been followed by her primary care  physician, Dr. Elias Else until recently.  She admits that in  retrospect, she has had progressive symptoms of exertional shortness of  breath for several years.  This has continued to gradually worsen, and  over the last year she has developed fairly significant exercise  intolerance.  She now gets short of breath with minimal physical  activity.  She reports 2-3 pillow orthopnea.  She reports regular  episodes of severe shortness of breath waking her up from her sleep at  night causing her to sit up in bed to catch her breath.  She has had  intermittent problems with bilateral lower extremity edema.  She has  also developed intermittent episodes of dizziness, although she has  never had any frank episodes of syncope.  Over the last several months,  she has began to develop substernal chest tightness and chest pressure.  These symptoms can come and go sporadically and are not necessarily  associated with physical activity.  Ultimately she was referred to Dr.  Cassell Clement and a 2-D echocardiogram was performed on December 03, 2008, at [facility].  This demonstrated the  presence of severe aortic stenosis with mild aortic regurgitation.  The  peak velocity across the valve measured an excess of 4 meters per second  corresponding to peak and mean transvalvular gradients estimated 65 and  38 mmHg respectively.  Aortic valve area was estimated 0.69 cm2 by the  continuity equation.  There is mild left ventricular  hypertrophy with  normal left ventricular systolic function.  There is mild-to-moderate  mitral regurgitation as well as mild tricuspid regurgitation and  evidence for mild pulmonary hypertension.  The patient subsequently  underwent a stress test to further evaluate her chest pain.  She was in  normal sinus rhythm at the onset of stress test, but exercised only 4  minutes and 38 seconds on stage I of Bruce protocol.  During the  recovery phase, she went into atrial fibrillation and persisted in  atrial fibrillation for several hours.  She was admitted to the hospital  and underwent left and right heart catheterization the following day on  December 10, 2008.  This confirmed the presence of severe aortic stenosis.  Left ventricular systolic function remained normal.  Baseline cardiac  output was normal.  Mean transvalvular gradient across the aortic valve  was 32 mmHg.  Pulmonary artery pressures were slightly elevated at 37/17  with a pulmonary capillary wedge pressure of 14.  Coronary arteriography  revealed eccentric 70% stenosis of the left anterior descending coronary  artery.  There is otherwise no significant coronary artery disease.  Postcatheterization, the patient developed groin hematoma and acute  blood loss anemia, which stabilized after discontinuation of Plavix and  IV heparin therapy.  The patient was eventually discharged home on December 13, 2008, and referred for elective surgical consultation.   REVIEW OF SYSTEMS:  General:  The patient reports normal appetite.  She  has lost  50 pounds in weight over the last year on an exercise program  and diet program.  This has been intentional and successful.  Cardiac:  Notable for functional class III symptoms of congestive heart failure  with intermittent episodes of resting shortness of breath, PND, and  orthopnea.  The patient also has frequent episodes of chest tightness  consistent with angina pectoris, functional class III to class  IV.  She  has intermittent dizzy spells, but no syncopal episodes.  She has had  problems with bilateral lower extremity edema, but this has improved in  recent weeks on her current medical therapy.  Respiratory:  Notable for  intermittent episodes of tracheobronchitis, none recently.  She denies  productive cough, hemoptysis, wheezing.  Gastrointestinal:  Negative.  The patient has no difficulty swallowing.  She has previous history of  GE reflux disease and hiatal hernia with longstanding problems which all  resolved after laparoscopic Nissen fundoplication and repair of hiatal  hernia in 2009.  She reports normal bowel function with no history of  hematochezia, hematemesis, or melena.  Genitourinary:  Negative.  Musculoskeletal:  Notable for mild intermittent bilateral leg pain,  right greater than left.  Neurologic:  Notable only for intermittent  transient dizzy spells.  The patient denies transient monocular  blindness, transient numbness, or weakness involving either upper or  lower extremity.  HEENT:  Negative.  The patient has full set dentures.  Infectious:  Negative.  The patient denies recent fevers or chills.   PAST MEDICAL HISTORY:  1. Aortic stenosis.  2. Hypertension.  3. Type 2 diabetes mellitus.  4. Hyperlipidemia.  5. Obesity.  6. GE reflux disease with hiatal hernia, status post laparoscopic      Nissen fundoplication.  7. Degenerative arthritis.  8. Asymptomatic carotid stenosis.  9. Osteopenia.  10.Iron-deficient anemia and vitamin B12 deficiency.  11.Paroxysmal atrial fibrillation, new onset during stress test on      December 09, 2008 (resolved).   PAST SURGICAL HISTORY:  1. Laparoscopic Nissen fundoplication and repair of hiatal hernia.  2. Laparoscopic cholecystectomy.  3. Abdominal hysterectomy.   FAMILY HISTORY:  Notable that the patient's daughter suffered a sudden  myocardial infarction and numerous brothers have history of premature  coronary artery  disease.   SOCIAL HISTORY:  The patient has been widowed for 7 years.  She  currently lives with one of her daughters locally here in [place].  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  She still drives an automobile and attends  to simple daily needs, although she lives a somewhat sedentary  lifestyle.  She is a nonsmoker.  She denies alcohol consumption.   CURRENT MEDICATIONS:  1. Metoprolol 12.5 mg twice daily.  2. Vytorin 10/80 one-half tablet daily.  3. Evista 60 mg daily.  4. Micardis 20 mg daily.  5. Colace 200 mg daily.  6. Iron sulfate 325 mg 3 times daily.  7. Aspirin 81 mg daily.  8. Vitamin B12 1000 mg monthly.  9. Ambien 5 mg at bedtime for sleep as needed.  10.Sublingual nitroglycerin as needed.  11.Lasix 20 mg daily as needed.  12.Actos 15 mg daily.   DRUG ALLERGIES:  None known.   PHYSICAL EXAMINATION:  GENERAL:  The patient is well-appearing, mildly  obese female, who appears her stated age in no acute distress.  VITAL SIGNS:  Blood pressure 137/65, pulse 74, and oxygen saturation 97%  on room air.  HEENT:  Unrevealing.  NECK:  There are bilateral carotid bruits.  There is no jugular venous  distention.  There is no palpable lymphadenopathy.  LUNGS:  Auscultation of the chest demonstrates clear breath sounds with  a few inspiratory crackles at the lung bases.  No wheezes or rales are  noted.  CARDIOVASCULAR:  Regular rate and rhythm.  There is a prominent grade 3-  4/6 crescendo-decrescendo systolic murmur heard best along the sternal  border with radiation to the neck and across the precordium.  No  diastolic murmurs are noted.  ABDOMEN:  Obese, but soft and nontender.  There are no palpable masses.  EXTREMITIES:  Warm and adequately perfused.  There is mild bilateral  lower extremity edema.  Distal pulses are not palpable in either lower  leg at the ankle.  There are changes of mild venous  insufficiency.  RECTAL/GU:  Deferred.  NEUROLOGIC:  Grossly nonfocal and symmetrical throughout.   DIAGNOSTIC TESTS:  Cardiac catheterization performed by Dr. Swaziland on  December 10, 2008, is reviewed.  This confirms the presence of severe aortic  stenosis with hemodynamic data as discussed previously.  There is single-  vessel coronary artery disease with perhaps 60-70% eccentric stenosis of  the left anterior descending coronary artery arising at the takeoff of  the diagonal branch.  This stenosis is certainly not critical, but  probably bad enough to require a need for concomitant coronary artery  bypass grafting.  There is otherwise no significant coronary artery  disease.  There is some mitral regurgitation on injection of the left  ventriculogram.  Results of 2-D echocardiogram performed at [facility] are reviewed and as discussed previously.   IMPRESSION:  Severe aortic stenosis with single-vessel coronary artery  disease and progressive symptoms of congestive heart failure and angina  pectoris.  I agree that the patient would best be treated with elective  aortic valve replacement and coronary artery bypass grafting.  There is  some mitral regurgitation noted on both her echo and her cath.  We will  reassess this at the time of surgery, and unless the mitral  regurgitation is moderate to severe, it will likely not require surgical  intervention.  She did develop new onset of atrial fibrillation during  her stress test, but this promptly resolved and was likely clearly  induced by stress.  On the circumstances, I do not feel that concomitant  maze procedure is necessary as hopefully these issues will resolve  following her surgery.  Nonetheless, she will certainly be at risk for  postoperative atrial dysrhythmias.   PLAN:  I have discussed matters at length with the patient and two of  her daughters here in the office today.  Alternative treatment  strategies  have been discussed.  Long-term prognosis with medical  therapy have been reviewed.  Surgical alternatives have been discussed  in detail and all their questions have been addressed.  We specifically  discussed what type of valve we would use to replace her valve, and  under the circumstances, I would favor using a bioprosthetic tissue  valve.  She understands this will come with a small risk for late  structural valve deterioration and failure depending upon her longevity.  The patient and her family understand and accept all potential  associated risks of surgery including but not limited to risk of death,  stroke, myocardial infarction, congestive heart failure, respiratory  failure, pneumonia, bleeding requiring blood transfusion, heart block or  bradycardia requiring permanent pacemaker, late recurrence  of coronary  artery disease, late complications related to valve replacement.  All of  their questions  have been addressed.  I have given her a prescription for amiodarone 200  mg p.o. twice daily to begin now in anticipation for surgery to decrease  her risk for postoperative atrial dysrhythmias.   Salvatore Decent. [name], M.D.  Electronically Signed   CHO/MEDQ  D:  12/30/2008  T:  12/31/2008  Job:  [id number]   cc:   Cassell Clement, M.D.  Peter M. Swaziland, M.D.  Robert A. Nicholos Johns, M.D.

## 2010-10-27 NOTE — Op Note (Signed)
NAMETISHINA, LOWN                  ACCOUNT NO.:  [id number]   MEDICAL RECORD NO.:  [id number]          PATIENT TYPE:  INP   LOCATION:  2309                         FACILITY:  [facility]   PHYSICIAN:  Salvatore Decent. [name], M.D. DATE OF BIRTH:  06-Dec-1935   DATE OF PROCEDURE:  01/10/2009  DATE OF DISCHARGE:                               OPERATIVE REPORT   PREOPERATIVE DIAGNOSES:  1. Severe aortic stenosis.  2. Single-vessel coronary artery disease.   POSTOPERATIVE DIAGNOSES:  1. Severe aortic stenosis.  2. Single-vessel coronary artery disease.   PROCEDURE:  Median sternotomy for aortic valve replacement (21-mm  [facility] Ease pericardial tissue valve) and coronary artery bypass  grafting x1 (left internal mammary artery to distal left anterior  descending coronary artery).   SURGEON:  Salvatore Decent. [name], MD   ASSISTANT:  Doree Fudge, PA   ANESTHESIA:  General.   BRIEF CLINICAL NOTE:  The patient is a 75 year old widowed white female  from [place] with longstanding history of aortic stenosis.  The  patient has recently developed progressive symptoms of exertional  shortness of breath and orthopnea as well as intermittent episodes of  chest tightness.  Followup echocardiogram demonstrates severe aortic  stenosis with normal left ventricular systolic function, mild mitral  regurgitation, and mild-to-moderate mitral regurgitation.  Left and  right heart catheterization confirmed the presence of severe aortic  stenosis and document the presence of single-vessel coronary artery  disease.  A full consultation note has been dictated previously.  The  patient and her family have been counseled at length regarding the  indications, risks, potential benefits of surgery.  Alternative  treatment strategies have been discussed.  They understand and accept  all potential associated risks and desire to proceed with surgery as  described.   OPERATIVE FINDINGS:  1. Normal left  ventricular systolic function.  2. Mild left ventricular hypertrophy.  3. Severe aortic stenosis.  4. Mild aortic regurgitation.  5. Mild mitral regurgitation.  6. Good-quality left internal mammary artery conduit for grafting.  7. Good-quality target vessel for grafting.   OPERATIVE NOTE IN DETAIL:  The patient was brought to the operating room  on the above-mentioned date and central monitoring was established by  the anesthesia team under the care and direction of Dr. Arta Bruce.  Specifically, a Swan-Ganz catheter was placed in the right internal  jugular approach.  A radial arterial line was placed.  Intravenous  antibiotics were administered.  Following induction with general  endotracheal anesthesia, Foley catheter was placed.  The patient's  chest, abdomen, both groins, and both lower extremities were prepared  and draped in sterile manner.   Baseline transesophageal echocardiogram was performed by Dr. Michelle Piper.  This demonstrated the presence of normal left ventricular systolic  function.  There was severe aortic stenosis.  There was mild aortic  regurgitation.  There was mild mitral regurgitation.  No other  significant abnormalities were noted.   A median sternotomy incision was performed and the left internal mammary  artery was dissected from the chest wall and prepared for bypass  grafting.  The left  internal mammary artery was good-quality conduit.  Following systemic heparinization, the left internal mammary artery was  transected distally and noted to have excellent flow.   Pericardium was opened.  The ascending aorta was normal in appearance.  The ascending aorta and the right atrium were cannulated for  cardiopulmonary bypass.  A retrograde cardioplegic catheter was placed  through the right atrium into the coronary sinus.  Cardiopulmonary  bypass was begun and a left ventricular vent was placed through the  right superior pulmonary vein.  A temperature probe was  placed in the  left ventricular septum and a cardioplegic cannula was placed in the  ascending aorta.   The patient was allowed to cool passively to 28 degrees systemic  temperature.  The aortic crossclamp was applied and cold blood  cardioplegia was delivered initially in an antegrade fashion through the  aortic root.  Ice saline slush was applied for topical hypothermia.  Supplemental cardioplegia was administered retrograde through the  coronary sinus catheter.  The initial cardioplegic arrest and myocardial  cooling were notably excellent.  Repeat doses of cardioplegia were  administered intermittently throughout the crossclamp portion of the  operation either antegrade through the aortic root or retrograde through  the coronary sinus catheter to maintain completely flat  electrocardiogram and septal myocardial temperature below 15 degrees  centigrade.   The left internal mammary artery was grafted to the distal left anterior  descending coronary artery in an end-to-side fashion.  The left anterior  descending coronary artery measured 2.0 mm in diameter and was a good-  quality target vessel for grafting.   An oblique aortotomy incision was performed.  The aortic valve was  exposed.  The aortic valve was tricuspid and heavily calcified and  stenotic.  The left main and right coronary arteries were both in their  normal anatomical location.  The aortic valve was excised sharply.  The  aortic annulus was decalcified.  This was technically straightforward.  The aortic root was irrigated with saline solution.  The aortic annulus  was incised to accept a 21-mm stented bioprosthetic tissue valve.   The aortic valve replacement was performed using interrupted 2-0  Ethibond horizontal mattress pledgeted sutures with pledgets in the  subannular position.  An [facility] Ease pericardial tissue valve  (model number 3300 TFX, serial number [id number]) was secured in place  uneventfully.   After seating the valve, there appeared to be adequate  room between valve sewing cuff in both left main and right coronary  arteries.  Rewarming was begun.   The aortotomy incision was closed using a two-layered closure of running  4-0 Prolene suture.  One final dose of warm retrograde hot shot  cardioplegia was administered.  The lungs were ventilated and heart  allowed to fill and all air was evacuated through the aortic root.  The  aortic crossclamp was removed after crossclamp time of 105 minutes.  Heart was cardioverted and spontaneous rhythm resumed.  The retrograde  cardioplegic cannula was removed.  Right ventricular pacing wire was  fixed under the surface of the right ventricular free wall.  Epicardial  pacing wires were fixed to the right atrial appendage.  The patient was  rewarmed to 37 degrees centigrade temperature.  A Magoon needle was  placed in the ascending aorta to serve as a root vent.  The lungs were  ventilated and heart allowed to fill to evacuate any residual air after  which time both the aortic root vent and left ventricular  vent were both  removed.   The patient was weaned from cardiopulmonary bypass without difficulty.  The patient's rhythm at separation from bypass was normal sinus rhythm.  Atrial pacing was employed to increase the heart rate.  Total  cardiopulmonary bypass time of the operation was 125 minutes.  The  patient was weaned from cardiopulmonary bypass without inotropic  support.   Followup transesophageal echocardiogram performed by Dr. Michelle Piper after  separation from bypass demonstrated a well-seated bioprosthetic tissue  valve in the aortic position.  The valve appeared to be functioning  normally.  There was no aortic regurgitation.  Left ventricular function  remained normal.  There remained only mild mitral regurgitation.  No  other significant abnormalities were noted.   The venous and arterial cannulae were both removed uneventfully.   Protamine was administered to reverse the anticoagulation.  The  mediastinum and left pleural space were irrigated with saline solution  and inspected for meticulous hemostasis.  The mediastinum and the left  pleural space were drained with 3 chest tubes exited through separate  stab incisions inferiorly.  The pericardium and soft tissues interior to  the aorta were reapproximated loosely.  The sternum was closed using  double-strength sternal wire.  The soft tissues anterior to the sternum  were closed in multiple layers and the skin was closed with a running  subcuticular skin closure.   The patient tolerated the procedure well and was transported to the  surgical intensive care unit in stable condition.  There were no  intraoperative complications.  All sponge, instrument, and needle counts  were verified correct at completion of the operation.      Salvatore Decent. [name], M.D.  Electronically Signed     CHO/MEDQ  D:  01/10/2009  T:  01/11/2009  Job:  [id number]   cc:   Cassell Clement, M.D.  Peter M. Swaziland, M.D.  Robert A. Nicholos Johns, M.D.

## 2010-10-27 NOTE — H&P (Signed)
NAMEZAMORA, COLTON                  ACCOUNT NO.:  [id number]   MEDICAL RECORD NO.:  [id number]          PATIENT TYPE:  INP   LOCATION:  3712                         FACILITY:  [facility]   PHYSICIAN:  Cassell Clement, M.D. DATE OF BIRTH:  08-21-1935   DATE OF ADMISSION:  12/09/2008  DATE OF DISCHARGE:                              HISTORY & PHYSICAL   CHIEF COMPLAINT:  New-onset atrial fibrillation, and severe aortic  stenosis.   HISTORY:  This is a 75 year old Caucasian female who is admitted to [facility] on the afternoon of December 09, 2008.  This woman was first seen  in our office on November 26, 2008.  She was seen for evaluation of chest  pressure and hypotension at the request of Dr. Elias Else at that  time.  She gave a history of having had a heart murmur since childhood.  Of note, it was the fact that the patient's 63 year old daughter had  recently suffered a massive myocardial infarction and survive and the  patient was concerned that she did not want to be surprised by any  unexpected heart attack event herself.  She was seen on June 29, 2008, was noted to have a grade 2/6 systolic ejection murmur.  She was  also noted to have bilateral carotid bruits, louder on the right and  gave a history that she had been diagnosis with a moderate right carotid  stenosis and was on Plavix for that.  She also gave a history of having  had diabetes and essential hypertension, and dyslipidemia.  We had a  return for a two-dimensional echocardiogram on December 03, 2008 and that  showed severe aortic stenosis with a calculated aortic valve area of  0.69 and a peak velocity of 4 meters per second.  Her left ventricular  ejection fraction was 55-60% and she had mild left ventricular  hypertrophy.  In addition to the severe aortic stenosis, she had mild  aortic insufficiency, mild-to-moderate mitral regurgitation and she had  mild pulmonary hypertension with pulmonary artery pressure of 41.   The  patient returned to the office today as scheduled for a nuclear stress  test to evaluate her symptoms of chest pressure.  She was in normal  sinus rhythm at the onset of the stress test.  She exercise for 4  minutes and 38 seconds on stage I of the Bruce protocol.  Subsequent  analysis of the nuclear study showed no evidence of ischemia by  perfusion.  However, in the recovery phase, the patient went from sinus  rhythm into atrial fibrillation.  She remained in atrial fibrillation  over the next several hours under observation and despite being given 50  of Toprol during the recovery phase.  For this reason, it was elected to  admit her to the hospital and start her on IV heparin and anticipate  cardiac catheterization in the morning to evaluate her tight aortic  stenosis further.   Her review of systems is otherwise noncontributory.  She has had a  bothersome tickle in her throat which we thought might be related to  her  lisinopril and she was taken off lisinopril and switch to half of a 40  mg Micardis daily.  As noted she did have a history of diabetes, but has  not been experiencing any hypoglycemic episodes.  She has had no history  of transient ischemic attack or stroke and she does have known carotid  bruit.  Psychiatric history is negative.  All other review of systems  are negative in detail.   Family history is positive for stroke and vascular disease.   Social history reveals that she does not use alcohol or tobacco.  She is  a retired Engineer, civil (consulting) in Building control surveyor.   Past medical history includes total hysterectomy in 2003,  cholecystectomy in 2005, laparoscopic repair of hiatal hernia in 2009.   MEDICATIONS AT THE TIME OF ADMISSION:  1. Avandia 4 mg b.i.d.  2. Furosemide 20 mg p.r.n. for fluid.  3. Vytorin 10/80 one daily.  4. Plavix 75 mg daily.  5. Evista 60 mg daily.  6. Aspirin 81 mg daily.  7. Over-the-counter iron daily.  8. Micardis 40 mg half tablet  daily.   PHYSICAL EXAMINATION:  VITAL SIGNS:  Her blood pressure in atrial  fibrillation was 116/70, pulse is 100 and irregularly irregular,  respirations are normal.  GENERAL:  A well-developed and well-nourished woman in no acute  distress.  SKIN:  Clear.  HEENT:  Pupils are equal and reactive.  Sclerae nonicteric.  The mouth  and pharynx are normal.  NECK:  Carotids reveal soft right carotid bruit.  Jugular venous  pressure normal.  Thyroid normal.  No lymphadenopathy.  CHEST:  Clear.  HEART:  A grade 2/6 systolic ejection murmur at the base radiating to  the neck.  There are bilateral carotid bruits, but louder on the right.  ABDOMEN:  No hepatosplenomegaly or mass.  EXTREMITIES:  Good peripheral pulses.  No edema.  No phlebitis.  NEUROLOGIC:  Physiologic.  EXTREMITIES:  There is good pedal pulses.  SKIN:  No skin rash.  PSYCHIATRIC:  Normal.  MUSCULOSKELETAL:  No muscle atrophy.   IMPRESSION:  1. Severe aortic stenosis.  2. Paroxysmal atrial fibrillation occurring during recovery of a      stress test earlier today.  3. Diabetes mellitus.  4. Dyslipidemia.  5. Asymptomatic carotid bruits.  6. Osteopenia.   DISPOSITION:  We are admitting to [facility].  We will treat with  beta-blocker and paced her on IV heparin.  We will make arrangements for  cardiac catheterization by Dr. Peter Swaziland to occur on December 10, 2008.           ______________________________  Cassell Clement, M.D.     TB/MEDQ  D:  12/09/2008  T:  12/09/2008  Job:  [id number]   cc:   Molly Maduro A. Nicholos Johns, M.D.  Peter M. [place], M.D.

## 2010-10-27 NOTE — Discharge Summary (Signed)
[name], [name]                  ACCOUNT NO.:  [id number]   MEDICAL RECORD NO.:  [id number]          PATIENT TYPE:  INP   LOCATION:  1526                         [facility]ACILITY[facility]  [facility]   PHYSICIAN:  Wilmon Arms. Corliss Skains, M.D. DATE OF BIRTH:  [birth year]   DATE OF ADMISSION:  08/07/2007  DATE OF DISCHARGE:  08/09/2007                               DISCHARGE SUMMARY   ADMISSION DIAGNOSES:  1. Hiatal hernia.  2. Gastroesophageal reflux disease.   DISCHARGE DIAGNOSES:  1. Hiatal hernia.  2. Gastroesophageal reflux disease.   PROCEDURE:  Laparoscopic hiatal hernia repair and Nissen fundoplication,  also a Gastrografin swallow.   BRIEF HISTORY:  The patient is a 75 year old female who has had a long  history of hiatal hernia.  Her symptoms have become worse and she is  having some pulmonary difficulties.  Medical management has failed to  improve her symptoms.  She now presents for elective laparoscopic hiatal  hernia repair.   HOSPITAL COURSE:  The patient underwent a laparoscopic hiatal hernia  repair and Nissen fundoplication on August 07, 2007.  She did  extremely well during surgery.  She had a Gastrografin swallow on postop  day #1.  She had no sign of leak or obstruction.  The patient has  noticed changes in her pulmonary function immediately.  She is on a  clear liquid diet.  She is tolerating her pain without difficulty.   DISCHARGE INSTRUCTIONS:  She is given Lortab Elixir 15 mL p.o. q.6 h  p.r.n. for pain.  She is also on Nexium as well as p.r.n. Phenergan.  Her wounds look good.  She may shower.  Follow up in 2 weeks with Dr.  Corliss Skains.      Wilmon Arms. Tsuei, M.D.  Electronically Signed     MKT/MEDQ  D:  08/09/2007  T:  08/09/2007  Job:  [id number]   cc:   Everardo All. Madilyn Fireman, M.D.  Fax: [phone]

## 2010-10-27 NOTE — Op Note (Signed)
NAMEMARWA, Amber [name]                  ACCOUNT NO.:  [id number]   MEDICAL RECORD NO.:  [id number]          PATIENT TYPE:  INP   LOCATION:  1526                         FACILITY:  [facility]   PHYSICIAN:  Wilmon Arms. Corliss Skains, M.D. DATE OF BIRTH:  February 11, 1936   DATE OF PROCEDURE:  08/07/2007  DATE OF DISCHARGE:                               OPERATIVE REPORT   PREOPERATIVE DIAGNOSES:  1. Hiatal hernia.  2. Gastroesophageal reflux disease.   POSTOPERATIVE DIAGNOSES:  Not given.   SURGEON:  Dr. Corliss Skains.   PROCEDURE:  Laparoscopic hiatal hernia repair with Nissen  fundoplication.   SURGEON:  Dr. Corliss Skains, FACS   ASSISTANT:  Dr. Bertram Savin.   ANESTHESIA:  General endotracheal.   INDICATIONS:  The patient is a 75 year old female who presents with a  several year history of reflux.  She has had a long history of a known  hiatal hernia.  She developed increasing symptoms which she is unable to  lie flat at night due to shortness of breath.  She frequently has water  brash taste in her throat. She occasionally has dysphagia as well as  regurgitation.  A workup has shown a large hiatal hernia with the  majority of the fundus of the stomach above the diaphragm. The patient's  symptoms improved slightly after Dr. Madilyn Fireman put her on Zegerid. However,  recently her symptoms have become worse.  She has finally decided to  proceed with surgery.   DESCRIPTION OF PROCEDURE:  The patient was brought to the operating room  and placed in the supine position on the operating room table.  After an  adequate level of general anesthesia was obtained, a Foley catheter was  placed under sterile technique.  The patient's legs were placed in  lithotomy position in yellow fin stirrups.  Her abdomen was prepped with  Betadine and draped in a sterile fashion.  A time-out was taken to  ensure the proper patient and proper procedure.  A 10-mm incision was  made 2 cm above the umbilicus to the left of  midline. An OptiVu  trocar  was then slowly advanced into the peritoneal cavity.  A pneumoperitoneum  was obtained by insufflating CO2 and maintaining a maximum pressure of  15 mmHg.  The patient was tilted into Trendelenburg and rolled slightly  to her left. A 5-mm port was placed in the subxiphoid position.  This  tract was dilated slightly.  The port was then removed and a Nathanson  retractor was inserted.  This was used to lift the liver exposing the  hiatus.  A moderate-sized hiatal hernia defect was identified. Two 11-mm  ports were placed in the mid clavicular lines on either side. An  additional 5 mm port was placed laterally on the left in the anterior  axillary line.  We were able to reduce most of the stomach out of the  chest.  We then opened the gastrohepatic ligament with the harmonic  scalpel.  We continued our dissection up onto the right crus of the  diaphragm and carried our dissection anteriorly.  We then exposed the  entire right crus of the diaphragm down to the junction with the left  crus.  We then moved the stomach to the right and took down the short  gastric vessels with the harmonic scalpel.  We continued up onto the  left crus of the diaphragm.  Posteriorly we were able to visualize the  right crus and a retroesophageal window.  The stomach was fairly mobile  and we had adequate length of esophagus down in the abdomen.  The hiatus  was quite loose.  We then closed the hiatus posteriorly with three  interrupted #0 Surgidac sutures using pledgets.  This successfully  closed down the hiatus around the esophagus.  A 56-French bougie was  then passed into the stomach under illuminated guidance. We then created  our fundoplication by reaching posteriorly behind the retroesophageal  window and pulling the fundus of the stomach from the left to the right.  We created a loose wrap with three interrupted #0 Surgidac sutures.  Two  of the sutures took a small bite of the anterior wall of the  esophagus.  The entire length of the graft was about 3-1/2 to 4 cm. We had adequate  length of intra-abdominal esophagus.  The wrap did not seem to be under  any tension.  We then inspected for hemostasis.  We irrigated and  suctioned out the irrigation. The Nathanson retractor was then removed  under direct visualization.  The trocars were removed as the  pneumoperitoneum was released.  Hemostasis at the skin incisions was  obtained with cautery.  A 4-0 Monocryl was used to replace a deep dermal  sutures.  Dermabond was used to close skin.  The patient was then  extubated and brought to the recovery room in stable condition.  All  sponge, instrument, and needle counts were correct.      Wilmon Arms. Tsuei, M.D.  Electronically Signed     MKT/MEDQ  D:  08/07/2007  T:  08/07/2007  Job:  [id number]

## 2010-10-27 NOTE — Assessment & Plan Note (Signed)
OFFICE VISIT   ETHAL, GOTAY  DOB:  06/17/1935                                        April 07, 2009  CHART #:  [id number]   The patient returns to the office today for further follow up status  post aortic valve replacement and coronary artery bypass grafting x1 on  January 10, 2009.  She was last seen here in the office on February 10, 2009.  Since then, she continues to do remarkably well.  She has had no  problems whatsoever.  She is actively participating in the cardiac rehab  program, and her exercise tolerance is improving dramatically.  She  states that she is doing much better than she had been doing for years  prior to her surgery.  Her only problem is that she has been having  problems with hypoglycemia every morning as it seems so though her need  for oral hypoglycemic agents for diabetes have decreased since her  surgery.  Overall, she is getting along great.   Physical exam is notable for well-appearing female with blood pressure  134/72, pulse 57, oxygen saturation 98% on room air.  Examination of the  chest reveals a median sternotomy scar that is healing nicely.  The  sternum is stable on palpation.  Breath sounds are clear to auscultation  and symmetrical bilaterally.  No wheezes or rhonchi are noted.  Cardiovascular exam includes regular rate and rhythm.  The abdomen is  soft, nontender.  The extremities are warm and well perfused.  There is  no lower extremity edema.   IMPRESSION:  Excellent progress following aortic valve replacement and  coronary artery bypass grafting.  The patient is doing extremely well.  She probably can come off Coumadin at this time.  She has had no  symptoms or signs to suggest recurrence of irregular heart rhythm.   PLAN:  I have suggested that the patient could probably stop Coumadin  when she is seen for followup by Dr. Patty Sermons next week presuming that  he agrees and that her rhythm remains stable.  At this  point, she can  continue to gradually increase her physical activity as tolerated.  All of her questions have been addressed.  In the future, she can call  and return to see [place] here as needed.   Salvatore Decent. [name], M.D.  Electronically Signed   CHO/MEDQ  D:  04/07/2009  T:  04/07/2009  Job:  [id number]   cc:   Cassell Clement, M.D.  Peter M. Swazil[place], M.D.  Robert A. Nicholos Johns, M.D.

## 2010-10-27 NOTE — Discharge Summary (Signed)
NAMECYNDEL, Amber [name]                  ACCOUNT NO.:  [id number]   MEDICAL RECORD NO.:  [id number]          PATIENT TYPE:  INP   LOCATION:  3712                         FACILITY:  [facility]   PHYSICIAN:  Cassell Clement, M.D. DATE OF BIRTH:  12/26/1935   DATE OF ADMISSION:  12/09/2008  DATE OF DISCHARGE:  12/13/2008                               DISCHARGE SUMMARY   FINAL DIAGNOSES:  1. Moderate-to-severe aortic stenosis.  2. Single vessel obstructive coronary artery disease.  3. Paroxysmal atrial fibrillation, resolved.  4. Diabetes mellitus.  5. History of asymptomatic carotid bruits.  6. Osteopenia.  7. Dyslipidemia.  8. Anemia, secondary to vitamin B12 deficiency and iron deficiency      anemia with negative stool guaiac.   OPERATIONS PERFORMED:  Right and left heart cardiac catheterization with  coronary angiography on December 10, 2008, by Dr. Peter Swaziland.   HISTORY:  This 75 year old Caucasian female is a medical patient of Dr.  Elias Else.  She was seen in our office initially on November 26, 2008,  for evaluation of chest pressure and fluctuating blood pressure.  She  gave a history of having heart murmur since childhood.  She was noted to  have a grade 2/6 systolic ejection murmur as well as bilateral carotid  bruits and she gave a history that she had been previously diagnosed  with a moderate right carotid stenosis and was on Plavix for that.  We  had a 2-dimensional echocardiogram done in the office on December 03, 2008,  which showed severe aortic stenosis with a calculated aortic valve area  of 0.69, and a peak velocity of 4 meters a second.  She had mild aortic  insufficiency on echo as well as mild-to-moderate mitral regurgitation  and mild pulmonary hypertension with a pulmonary artery pressure of 41.  She returned to the office on December 09, 2008, for nuclear stress test to  evaluate her symptoms of chest pressure.  She exercised for 4 minutes  and 38 seconds on stage I of the  Bruce protocol.  She was in sinus  rhythm until the recovery phase and then she went into atrial  fibrillation.  The nuclear stress test did not show any evidence of  reversible ischemia.  However, she remained in atrial fibrillation and  was decided to admit her and proceed with IV heparin and cardiac  catheterization, and attempt at cardioversion.   PHYSICAL EXAMINATION:  VITAL SIGNS:  Blood pressure was 116/70, pulse is  100 and irregularly irregular.  The carotids revealed soft bruits, right  louder than left.  Chest:  Clear.  HEART:  Grade 2/6 systolic ejection murmur.  ABDOMEN:  Negative.  EXTREMITIES:  Good pulses.  No edema.  No phlebitis.   HOSPITAL COURSE:  The patient was admitted to telemetry.  She was  started on IV heparin.  Fortunately, she converted to normal sinus  rhythm on the afternoon of admission.  Arrangements were made for her to  undergo cardiac catheterization, which was done the following morning by  Dr. Peter Swaziland.  We found that she had moderate to severe  aortic  stenosis with a calculated aortic valve area of 1.1 cm2 and a index of  0.6 cm2, and ejection fraction of 65%.  She had an eccentric 70-80%  stenosis at the bifurcation of the first diagonal.  Impression was that  she had moderate to severe aortic stenosis as well as single vessel  obstructive coronary disease and would need combined aortic valve  replacement and coronary artery bypass.  The patient's discharge was  delayed on the day after cath because of the development of painful  pulsatile bulge in the right femoral raising question of possible  pseudoaneurysm.  She was kept on bedrest.  Her aspirin was held and an  ultrasound of the groin was obtained.  It was noted that her hemoglobin  had dropped to 8.9.  The ultrasound was negative for pseudoaneurysm or  AV fistula.  On December 12, 2008, her hemoglobin had dropped to 8.8, and she  had had no bowel movements to check for occult blood.  Anemia  panel  showed iron deficiency as well as B12 deficiency.  She was begun on B12  shots 1000 mcg daily for 3 days and then weekly for 1 month and then  monthly.  She was also started on oral iron.  She was observed in the  hospital on December 12, 2008, was allowed to ambulate and did have a bowel  movement, which tested negative for blood.  The following morning, her  hemoglobin had risen from 8.8-9.1 and she was feeling well and was ready  for discharge.   DIET:  The patient will be discharged on a low-sodium diabetic diet.   DISCHARGE INSTRUCTIONS:  She is to avoid heavy lifting or straining.   FOLLOWUP:  She will return to see Dr. Patty Sermons in 1 week for office  visit and CBC.  The patient has requested.  Dr. [name] for her cardiac  surgery and we will call as office and arrange for an outpatient  appointment for her to discuss open heart surgery with Dr. [name].   DISCHARGE MEDICATIONS:  1. Metoprolol 25 mg half tablet twice a day.  2. Actos 15 mg daily.  3. Vytorin 10/80 one daily.  4. Evista 60 mg daily.  5. Micardis 20 mg daily.  6. Colace 200 mg daily.  7. Ferrous sulfate 325 one daily.  8. Aspirin 81 mg daily.  9. Vitamin B12 1000 mcg weekly for 4 weeks, then monthly.  10.Ambien 5 mg nightly p.r.n. for sleep.  11.Nitrostat 1/150 sublingually p.r.n. chest pain,  12.Lasix 20 mg p.r.n. for fluid retention.   At this point, she is to stop Plavix in anticipation of heart surgery  and we are also switching her from Avandia to Actos at her request.   CONDITION ON DISCHARGE:  Improved.   Time spent in preparation of discharge papers and explanation with the  patient and her daughter 75reater than 30 minutes.           ______________________________  Cassell Clement, M.D.     TB/MEDQ  D:  12/13/2008  T:  12/13/2008  Job:  [id number]   cc:   Molly Maduro A. Nicholos Johns, M.D.  Peter M. Swaziland, M.D.  Salvatore Decent. [name], M.D.

## 2010-10-27 NOTE — Cardiovascular Report (Signed)
Amber [name], Amber [name]                  ACCOUNT NO.:  [id number]   MEDICAL RECORD NO.:  [id number]          PATIENT TYPE:  INP   LOCATION:  3712                         FACILITY:  [facility]   PHYSICIAN:  Peter M. Swaziland, M.D.  DATE OF BIRTH:  04/16/1936   DATE OF PROCEDURE:  12/10/2008  DATE OF DISCHARGE:                            CARDIAC CATHETERIZATION   INDICATIONS FOR PROCEDURE:  The patient is a 75 year old white female  with history of severe aortic stenosis by echocardiogram.  She presents  with new onset of atrial fibrillation associated with chest pain.   PROCEDURES:  1. Right and left heart catheterization.  2. Coronary and left ventricular angiography.  3. Access via the right femoral artery and vein using standard      Seldinger technique.   EQUIPMENT:  1. A 6-French 4 cm right and left Judkins catheter.  2. A 6-French pigtail catheter.  3. A 6-French arterial sheath.  4. A 7-French venous sheath.  5. A 7-French balloon-tip Swan-Ganz catheter.   MEDICATIONS:  Local anesthesia with 1% Xylocaine, Versed 2 mg IV, and  fentanyl 25 mcg IV.   CONTRAST:  Omnipaque 120 mL.   HEMODYNAMIC DATA:  Right atrial pressure is 13/10 with a mean of 8 mmHg.   Right ventricular pressure is 39 with EDP of 13 mmHg.   Pulmonary artery pressure is 37/17 with a mean of 27 mmHg.   Pulmonary and capillary wedge pressure is 16/23, with a mean of 14 mmHg.   Aortic pressure is 122/52, with a mean of 80 mmHg.   Left ventricle pressure is 155 with an EDP of 20 mmHg.   Mean aortic valve gradient is 32 mmHg.  Aortic valve area is calculated  at 1.1 cm2 with an index of 0.6 cm2/kg.   Cardiac output by thermodilution 6.84 L/min with an index of 3.58.  By  Hiram Comber, cardiac output is 8.03 L/min with an index of 4.2.  There is no  mitral valve gradient.   Left ventricular angiography was performed in the RAO view.  This  demonstrates normal left ventricular size and contractility with normal  systolic  function.  Ejection fraction is estimated at 65%.  The aortic  valve is calcified.  There is no mitral regurgitation noted.   The left main coronary artery is normal.   The left anterior descending artery has moderate calcification in the  proximal to mid vessel.  There is an eccentric 70-80% stenosis at the  bifurcation with the first diagonal branch.  The remainder of the LAD is  without significant disease.   Left circumflex coronary artery appears normal.   The right coronary artery is a dominant vessel and is normal.   FINAL INTERPRETATION:  1. Moderate-to-severe aortic stenosis.  2. Single-vessel obstructive coronary artery disease.  3. Normal left ventricular function.  4. Normal right heart pressures.           ______________________________  Peter M. Swaziland, M.D.     PMJ/MEDQ  D:  12/10/2008  T:  12/11/2008  Job:  [id number]   cc:   Cassell Clement, M.D.

## 2010-10-30 NOTE — Op Note (Signed)
NAMEKALLEIGH, HARBOR                  ACCOUNT NO.:  [id number]   MEDICAL RECORD NO.:  [id number]          PATIENT TYPE:  AMB   LOCATION:  ENDO                         FACILITY:  [facility]   PHYSICIAN:  John C. Madilyn Fireman, M.D.    DATE OF BIRTH:  1936/05/29   DATE OF PROCEDURE:  07/04/2006  DATE OF DISCHARGE:  07/04/2006                               OPERATIVE REPORT   REASON FOR CONSULTATION:  Patient with a large hiatal hernia under  consideration for a Nissen fundoplication.   RESULTS:  1. LES study:  LES pressure 45.4 mm which was just above the upper      limits of normal at 45 mm.  Relaxation was 81% which was normal.  Lower esophageal body:  Ten wet swallows were administered, 90% were  peristaltic 10% were non transmitted. Contraction were of normal  amplitude, duration and velocity.   IMPRESSION:  Borderline elevated lower esophageal sphincter pressure,  otherwise normal study           ______________________________  Everardo All. Madilyn Fireman, M.D.     JCH/MEDQ  D:  07/11/2006  T:  07/11/2006  Job:  [id number]   cc:   Wilmon Arms. Tsuei, M.D.

## 2010-10-30 NOTE — Op Note (Signed)
NAME:  Amber [name], Amber [name]                            ACCOUNT NO.:  [id number]   MEDICAL RECORD NO.:  [id number]                   PATIENT TYPE:  AMB   LOCATION:  DAY                                  [facility]ACILITY:  [facility]   PHYSICIAN:  Malachi Pro. Ambrose Mantle, M.D.              DATE OF BIRTH:  01/10/1936   DATE OF PROCEDURE:  03/26/2003  DATE OF DISCHARGE:                                 OPERATIVE REPORT   PREOPERATIVE DIAGNOSIS:  Right pelvic mass.   OPERATION:  Abdominal hysterectomy, bilateral salpingo-oophorectomy, and  removal of right retroperitoneal mass.   OPERATOR:  Malachi Pro. Ambrose Mantle, M.D.   ASSISTANT:  Huel Cote, M.D.   ANESTHESIA:  General anesthesia.   Lebron Conners, M.D., on standby.   The patient was brought to the operating room and placed under satisfactory  general anesthesia.  She was placed in a frogleg position.  The abdomen,  vulva, vagina, and urethra were prepped with Betadine solution.  A Foley  catheter was inserted to straight drain.  Exam revealed what was thought to  be a right pelvic mass.  The uterus was anterior, hard to feel.  The left  adnexa was clear.  The patient was placed supine.  The abdomen was draped as  a sterile field.  A short midline incision was made and carried in layers  through the skin, subcutaneous tissue, and fascia, and then the peritoneum  was opened vertically.  I was able to confirm that there was a right pelvic  mass, but this mass involved neither the uterus nor the ovaries.  It was  retroperitoneal on the right.  I enlarged the incision to the umbilicus.  The upper abdomen was explored.  The liver felt smooth, although I could  only feel the lower part of the liver.  I did not feel the gallbladder.  I  thought both kidneys felt normal.  Exploration of the pelvis revealed the  cul-de-sacs to be free of disease.  The uterus was anterior.  It had about a  3 cm fibroid on the anterior surface.  Both tubes showed evidence of tubal  ligation.  Both ovaries were normal.  There was what was felt to be a cystic  mass in the right retroperitoneum in the broad ligament on the right.  The  upper pedicles were clamped across after packs and retractor were used for  exposure, and it was elected to do the hysterectomy and remove the tubes and  ovaries prior to attacking the mass.  I doubly suture ligated both  infundibulopelvic ligaments after cutting them between clamps.  This was  done after the round ligament bilaterally was divided.  The uterine vessels  were skeletonized, clamped, cut, and suture ligated, and the parametrial  tissues were clamped, cut, and suture ligated.  We had poorer tissue on the  right close to the mass.  As we worked our way  down, we clamped, cut, and  suture ligated and held the uterosacral ligaments, then entered the right  side of the vagina and removed the uterus by transecting the upper vagina.  Vaginal angle sutures were placed and then the central portion of the cuff  was closed with interrupted figure-of-eight sutures of 0 Vicryl.  The  uterosacral ligaments were sutured together in the midline to provide  support to the vaginal cuff.  Hemostasis was adequate.  I then turned my  attention to the retroperitoneal mass on the right.  I divided the round  ligament farther laterally between clamps and was able to find a plane of  dissection all the way around the mass, confirming my position in relation  to the ureter at all times.  I divided all the attachments of the mass and  finally was able to deliver it completely intact by clamping and cutting a  vascular pedicle that arose from its inferior margin.  I doubly suture  ligated this vascular attachment and sent the mass for frozen section.  Dr.  Guilford Shi said that it was an unusual-appearing, markedly edematous, completely  solid mass that could either be a parasitic leiomyoma, a neurofibroma, some  type of spindle cell tumor.  He felt that at  the worst it was a low-grade  malignancy.  There was no indication to do lymph nodes based on this  pathology report even though I did send washings.  A diligent search for  hemostasis was made.  Reperitonealization was done across the vaginal cuff  with 0 Vicryl and then the abdominal wall was closed with interrupted Marcello Moores sutures of #1 Novofil, incorporating the subcutaneous tissue, the  rectus muscle, the fascia, peritoneum, in a far-far, near-near type suture  pattern.  Gaps in the fascia were sutured with 0 Vicryl.  The subcu tissue  was closed with a running suture of 3-0 Vicryl, and the skin was closed with  automatic staples.  The patient seemed to tolerate the procedure well.  Blood loss was estimated at 500 mL, sponge needle counts were correct, and  she was returned to recovery in satisfactory condition.  Dr. Orson Slick was in  attendance throughout the procedure, ready to be called in.  We felt like we  never needed him, but he was on standby throughout the procedure.                                               Malachi Pro. Ambrose Mantle, M.D.    TFH/MEDQ  D:  03/26/2003  T:  03/26/2003  Job:  [id number]   cc:   Molly Maduro A. Nicholos Johns, M.D.  [address]  [place]  [place] [address]  Fax: [phone]   Lorre Munroe., M.D.  Fax: [phone]

## 2010-10-30 NOTE — Op Note (Signed)
[name], [name]                  ACCOUNT NO.:  [id number]   MEDICAL RECORD NO.:  [id number]          PATIENT TYPE:  OIB   LOCATION:  NA                           FACILITY:  [facility]   PHYSICIAN:  Gita Kudo, M.D. DATE OF BIRTH:  [birth year]   DATE OF PROCEDURE:  04/08/2004  DATE OF DISCHARGE:                                 OPERATIVE REPORT   PREOPERATIVE DIAGNOSES:  Biliary sludge, abnormal gallbladder ultrasound.   POSTOPERATIVE DIAGNOSES:  Biliary sludge, abnormal gallbladder ultrasound.   OPERATIVE PROCEDURE:  Laparoscopic cholecystectomy with intraoperative  cholangiogram.   SURGEON:  Gita Kudo, M.D.   ASSISTANT:  Anselm Pancoast. Zachery Dakins, M.D.   ANESTHESIA:  General endotracheal.   CLINICAL SUMMARY:  A 75 year old female with bouts of abdominal pain.  Family history of gallbladder disease.  She has mild diabetes, recent  ultrasound that showed sludge, the HIDA scan was okay.  Liver function  studies are normal.   OPERATIVE FINDINGS:  The gallbladder was thin-walled.  I could feel no  definite stones after I removed it.  The cholangiogram looked normal.   OPERATIVE PROCEDURE:  Under satisfactory general endotracheal anesthesia,  having received 1.0 g Ancef preop, the patient was prepped, draped and  positioned in the standard fashion.  A total of 30 mL of 0.5% Marcaine was  used at the skin incision sites for good postop analgesia.  Transverse  supraumbilical incision made, midline opened into the peritoneum.  Controlled with figure-of-eight 0 Vicryl suture and operating Hasson port  inserted, secured, and good CO2 pneumoperitoneum established.  Two #5 ports  placed through lateral incisions and a second #10 medially.  Lateral  graspers gave excellent exposure, and I carefully took down adhesions to the  gallbladder and circumferentially dissected the cystic duct and artery.  When certain of the anatomy, each of these structures was controlled with  multiple  clips for the artery and a single clip for the duct near the  gallbladder.  Incision made in the duct, percutaneous catheter placed, and  good cholangiograms obtained.  Catheter withdrawn, duct controlled with  multiple clips, and it and the artery divided.  Gallbladder then removed  from below upward using coagulation for hemostasis and dissection.  Operative site checked for hemostasis, lavaged with saline, and suctioned  dry.   Camera moved to the upper port, and through the lower port a large grasper  used to extract the intact gallbladder without spillage or complication.  Operative site again checked, lavaged and suctioned.  Then all ports and CO2  released.  Midline closed with previous figure-of-eight and a second  interrupted 0 Vicryl.  Subcu approximated with 4-0 Vicryl and all skin  incisions with Steri-Strips.  Sterile absorbent dressings were applied, and  the patient went to the recovery room from the operating room in good  condition without complication.       MRL/MEDQ  D:  04/08/2004  T:  04/08/2004  Job:  [id number]   cc:   Molly Maduro A. Nicholos Johns, M.D.  [address]  [place]  [place] [address]  Fax:  [phone] 

## 2010-10-30 NOTE — Op Note (Signed)
NAMECHALSEA, DARKO                  ACCOUNT NO.:  [id number]   MEDICAL RECORD NO.:  [id number]          PATIENT TYPE:  AMB   LOCATION:  ENDO                         FACILITY:  [facility]   PHYSICIAN:  John C. Madilyn Fireman, M.D.    DATE OF BIRTH:  1936/04/25   DATE OF PROCEDURE:  07/04/2006  DATE OF DISCHARGE:  07/04/2006                               OPERATIVE REPORT   PROCEDURE:  Esophageal 24-hour pH study.   INDICATIONS FOR PROCEDURE:  Patient with large hiatal hernia under  consideration for antireflux surgery.   RESULTS:  There were total percent of time with a pH less than 4 in the  distal channel was 7.2% normal being 4.2%. Overall DeMeester score was  35.9 with normal being less than 22.  There were a few reflux related  symptoms noted during the procedure with two episodes of belching and  two episodes of heartburn.  There was 100% correlation with the belching  and reflux episodes and 15% correlation between heartburn and reflux  episodes.   IMPRESSION:  Moderately elevated total reflux scores with fair symptom  correlation.   PLAN:  We will follow-up in the office and discuss results of this study  and manometry with the patient and it's significance in decision making  regarding treatment for hiatal hernia, reflux.           ______________________________  Everardo All. Madilyn Fireman, M.D.     JCH/MEDQ  D:  07/11/2006  T:  07/11/2006  Job:  [id number]   cc:   Wilmon Arms. Tsuei, M.D.

## 2010-10-30 NOTE — H&P (Signed)
NAME:  Amber [name], Amber [name]                            ACCOUNT NO.:  [id number]   MEDICAL RECORD NO.:  [id number]                   PATIENT TYPE:  AMB   LOCATION:  DAY                                  FACILITY:  [facility]   PHYSICIAN:  Malachi Pro. Ambrose Mantle, M.D.              DATE OF BIRTH:  [birth year]   DATE OF ADMISSION:  DATE OF DISCHARGE:                                HISTORY & PHYSICAL   HISTORY OF PRESENT ILLNESS:  This is a 75 year old white widowed female,  para 4, 0, 0, 4 admitted to the hospital for abdominal hysterectomy and  bilateral salpingo-oophorectomy because of a right pelvic mass that showed  up on ultrasound and CT scan.  The patient's last menstrual period is  uncertain; and, it has been a long time ago.  The patient reports that she  passed out while getting her hair fixed.  She was seen in the emergency  room.  A CT scan was done, which showed a right pelvic mass.  Ultrasound  confirmed the right pelvic mass that was vascular.  It is not known whether  it is ovarian in origin or an exophytic fibroid that occupied the area of  the right ovary.  The patient was advised to see me and I saw her on  March 14, 2003.  I could not feel the mass and had examined her three  times; and, I have never been able to feel the mass, but I did an ultrasound  in my office and again, it showed a mass that filled the right adnexal  region, and measured 7.6 x 4.8 x 4.4 cm.  The patient is admitted for  abdominal hysterectomy and bilateral salpingo-oophorectomy because even if  it is a benign fibroid the patient wants the surgery done.   The patient's past medical history reveals.   ALLERGIES:  No known allergies.   MEDICATIONS:  The patient takes lisinopril, aspirin, Zocor, Nexium and  furosemide.   PAST SURGICAL AND MEDICAL HISTORY:  1. The patient had a tubal ligation in 1976.  2. The patient had a pulmonary embolus in 1994 while she was on Prempro.   Illnesses:  1. The patient has  diabetes.  2. High blood pressure.  3. High cholesterol.  4. The patient has no known heart problems.   SOCIAL HISTORY:  No alcohol or tobacco.   REVIEW OF SYSTEMS:  Negative.   FAMILY HISTORY:  Mother died at 79 of renal failure and stroke.  Father died  at 62 of stroke and diabetes.  She has no sisters.  Three brothers; one with  cancer, one diabetes and one died from heart problems.   LABORATORY DATA:  The patient's laboratory data in Dr. Benjaman Pott office showed  an elevated BUN and creatinine; the BUN was 59 and creatinine 2.0.  Total  bilirubin was 1.7, otherwise the comprehensive metabolic profile was normal.   PHYSICAL  EXAMINATION:  GENERAL APPEARANCE:  Physical exam reveals well-  developed, obese white female.  VITAL SIGNS:  Blood pressure 120/70, pulse of 80, weight 204 pounds, and  height 5 feet 6 inches.  HEENT:  Head, eyes, ears, nose and throat reveal no cranial abnormalities.  Extraocular movements are intact.  Nose and pharynx are clear.  There are  upper and lower dental plates.  NECK:  The neck is supple without thyromegaly.  BREASTS:  Breasts are soft without masses.  LUNGS:  Lungs are clear to P&A.  HEART:  Normal size and sounds.  No murmurs.  ABDOMEN:  The abdomen is soft and flat.  It is obese, but no masses are  palpable.  It is obese, but no masses are palpable.  The liver, spleen and  kidneys are not felt.  VAGINAL EXAMINATION:  Pap smear on March 14, 2003 was within normal  limits.  CA-125 was 11.6.  Vulva and vagina are clean.  There is a second  degree cystocele and rectocele.  The cervix is clean.  The uterus is hard to  feel.  I do not feel a pelvic mass, I do see the mass by ultrasound.  I  cannot determine if it is ovarian or uterine in origin.   ADMITTING IMPRESSION:  Right pelvic mass.   The patient is admitted for abdominal hysterectomy and bilateral salpingo-  oophorectomy.  She has been informed of the risks of surgery including, but   not limited to heart attack, stroke, pulmonary embolus, wound disruption,  hemorrhage, need for reoperation, and/or transfusion, fistula formation,  nerve injury, and intestinal obstruction.  The patient has been advised that  I will make a small incision to confirm the presence of the mass since I  cannot feel than I will enlarge the incision, and remove the uterus, tubes  and ovaries.  If the mass proves to be malignant I have a general surgeon on  standby to do pelvic and periaortic lymph nodes, and omentectomy.  The  patient understands and agrees to proceed.                                                 Malachi Pro. Ambrose Mantle, M.D.    TFH/MEDQ  D:  03/25/2003  T:  03/26/2003  Job:  [id number]   cc:   Molly Maduro A. Nicholos Johns, M.D.  [address]  [place]  [place] [address]  Fax: [phone]

## 2010-10-30 NOTE — Procedures (Signed)
[facility]  Patient:    Amber [name], Amber [name]                         MRN: [id number] Proc. Date: 10/28/99 Adm. Date:  [id number] Disc. Date: [id number] Attending:  Louie Bun CC:         Carolyne Fiscal, M.D.                           Procedure Report  PROCEDURE:  Esophagogastroduodenoscopy with esophageal dilatation.  INDICATION FOR PROCEDURE:  Patient with history of cervical esophageal web and lower esophageal stricture, with the upper web dilated on a previous procedure but due to friability of the distal stricture, this was avoided.  She is status post treatment with two months of Prilosec and still having some distal esophageal dysphagia.  The procedure is with the intention of dilating the distal stricture, ideally to approximately 16 mm.  DESCRIPTION OF PROCEDURE:  The patient was placed in the left lateral decubitus position and placed on the pulse monitor with continuous low-flow oxygen delivered by nasal cannula.  She was sedated with 70 mg of Demerol and 7 mg of IV Versed.  The Olympus video endoscope was advanced under direct vision into the oropharynx and esophagus.  The esophagus was slightly tortuous but of normal caliber with the squamocolumnar line at 38 cm.  There did appear to be a short subglottic stricture, but this did not present any resistance to passage of the scope beyond it.  There appeared to be a small hiatal hernia distal to the stricture.  A retroflexed view of the cardia confirmed the hiatal hernia, and this was otherwise unremarkable.  The fundus, body, antrum, and pylorus all appeared normal.  The duodenum was entered, and both the bulb and second portion were well inspected and appeared to be within normal limits.  The Savary guidewire was placed through the endoscope channel and the scope withdrawn.  Savary dilators of 14 and 15 were then passed consecutively with essentially no resistance.  There was a minimal  smear of pinkish blood noted on the second dilator.  The 16 mm dilator was not available to use, and I decided to advance the 17 mm dilator over the guidewire very cautiously and if feeling of any significant resistance was encountered, stop short of the marked area of the enlargement to the maximum diameter.  As this marker approached, the diaphragm visualized fluoroscopically, I encountered no significant resistance and gently passed the marker beyond the area of the diaphragm and withdrew it together with the wire.  There was some bright red blood seen after withdrawal of this dilator.  The patient then returned to the recovery room in stable condition.  She tolerated the procedure well, and there were no immediate complications.  IMPRESSION: 1. Distal esophageal stricture. 2. Small hiatal hernia. 3. Status post dilatation of stricture to 17 mm.  PLAN:  Advance diet and observe response to dilatation.  Will continue Prilosec for now. DD:  10/28/99 TD:  11/03/99 Job: [id number]

## 2010-10-30 NOTE — Discharge Summary (Signed)
NAME:  BREON, DISS                            ACCOUNT NO.:  [id number]   MEDICAL RECORD NO.:  [id number]                   PATIENT TYPE:  INP   LOCATION:  0478                                 FACILITY:  [facility]   PHYSICIAN:  Malachi Pro. Ambrose Mantle, M.D.              DATE OF BIRTH:  09-20-1935   DATE OF ADMISSION:  03/26/2003  DATE OF DISCHARGE:  03/28/2003                                 DISCHARGE SUMMARY   HISTORY OF PRESENT ILLNESS:  This is a 75 year old white female admitted for  abdominal hysterectomy, bilateral salpingo-oophorectomy, and removal of a  pelvic mass.  The mass by CT scan and ultrasound was thought to be an  enlarged right ovary or a fibroid along side the uterus.  The patient  underwent abdominal hysterectomy and bilateral salpingo-oophorectomy before  the mass was approached.  The mass was a retroperitoneal mass in the right  pelvis.  It was dissected out, sent for frozen section, and found to be a  smooth muscle tumor that was completely solid and if malignant it was a very  low-grade malignancy.  Postoperatively, the patient did well.  Path report  confirmed that it was a very edematous leiomyoma without atypia.  This  leiomyoma was in no way attached to the uterus, so it was either a paracytic  leiomyoma from the uterus or arose from some other pelvic structure.  The  patient tolerated clear liquids, she ambulated well without difficulty, she  had no vomiting, her temperature maximum was 100 degrees, and on the second  postop day she was considered ready for discharge.   LABORATORY DATA:  Initial hemoglobin 12.9, hematocrit 36.9, white count  5400, platelet count 307,000.  Followup hematocrits were 33.8 and 31.8.  Normal differential.  Comprehensive metabolic profile was normal except for  a glucose of 105.  Urinalysis was negative.  Blood sugars taken on March 27, 2003, were 213 and 140.   FINAL DIAGNOSES:  1. 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  2. Leiomyoma of the uterus.  3. Diabetes.  4. High blood pressure.  5. Hypercholesterolemia.  6. History of pulmonary embolus.   OPERATION:  Abdominal hysterectomy, bilateral salpingo-oophorectomy, and  removal of a right pelvic retroperitoneal mass.   LABORATORY AND ACCESSORY DATA:  EKG was normal.  The patient's chest x-ray  showed decreased lung volumes with bibasilar atelectasis.   FINAL CONDITION:  Improved.   INSTRUCTIONS:  1. Clear liquid diet until passing flatus or having bowel movements.  2. Call with any fever greater than 100.4 degrees.  3. Call with any heavy vaginal bleeding or any unusual problems.  4. The patient is advised to be on the alert for any pulmonary symptoms or     venous problems in her legs.  5. She is to return to the office in five days.  6. Percocet 5/325 #20 tablets one every four  to six hours as needed for pain     is given at discharge.  7.     The patient is to continue her medications that she is on at home, including     Nexium 40 mg a day, Zocor 40 mg a day, Lisinopril/hydrochlorothiazide     20/12.5 one daily, and furosemide 20 mg one daily.  8. She is to return to see me in five days for followup examination.                                               Malachi Pro. Ambrose Mantle, M.D.    TFH/MEDQ  D:  03/28/2003  T:  03/28/2003  Job:  [id number]   cc:   Molly Maduro A. Nicholos Johns, M.D.  [address]  [place]  [place] [address]  Fax: [phone]   Lorre Munroe., M.D.  Fax: [phone]

## 2010-11-03 ENCOUNTER — Telehealth: Payer: Self-pay | Admitting: Cardiology

## 2010-11-03 MED ORDER — FUROSEMIDE 20 MG PO TABS: 40.0000 mg | ORAL_TABLET | Freq: Every day | ORAL | Status: DC

## 2010-11-03 NOTE — Telephone Encounter (Signed)
Lasix 20mg  1 BID reordered

## 2010-11-03 NOTE — Telephone Encounter (Signed)
PT'S DOSE FOR FUROSIMIDE WAS INCREASED TO 2 DAILY, HER SCRIPT IS FOR 1 DAILY ONLY, SHE WANTS TO KNOW IF A NEW ONE CAN BE CALLED IN?

## 2010-11-05 ENCOUNTER — Telehealth: Payer: Self-pay | Admitting: Cardiology

## 2010-11-05 NOTE — Telephone Encounter (Signed)
WENT TO ORIENTATION FOR CARDIAC REHAB AND SHE HAD SKIPPED HEARTBEATS WHEN SHE WAS WALKING OR AT LEAST THAT IS WHAT THEY TOLD HER. HAS CONCERNS!

## 2010-11-05 NOTE — Telephone Encounter (Signed)
Patient does get a little short of breath at times and blood pressure went up with exercising. blood pressure came down after rest.  Still gives out at times but not as bad as it used to be.  Just wanted to make sure you were aware of this prior to starting program.  2 patients ahead of her so could be a week or two before starts.  Please advise

## 2010-11-05 NOTE — Telephone Encounter (Signed)
Yes proceed with rehab.

## 2010-11-06 NOTE — Telephone Encounter (Signed)
Advised ok  

## 2010-11-13 ENCOUNTER — Encounter: Payer: PPO | Admitting: Cardiology

## 2010-12-13 ENCOUNTER — Encounter: Payer: PPO | Admitting: Cardiology

## 2010-12-21 ENCOUNTER — Telehealth: Payer: Self-pay | Admitting: Cardiology

## 2010-12-21 NOTE — Telephone Encounter (Signed)
recvd call from patient states she was at cardiac rehab at [facility].  Patient said the rehab nurse was sending information to [facility] regarding her heart skipping beats.  Please call.  Patient understands that they may receive a call back tomorrow.

## 2010-12-22 NOTE — Telephone Encounter (Signed)
Can feel her heart skipping a lot and makes her feel like "she's give out".  Feels like her heart starts skipping a lot when she goes to do anything.  Takes metoprolol 25 mg 1/2 twice daily. Heart rate usually around 66-77 at rehab. Please advise.

## 2010-12-23 NOTE — Telephone Encounter (Signed)
Increase metoprolol to a whole tablet in the morning and half a tablet in the evening

## 2010-12-23 NOTE — Telephone Encounter (Signed)
Advised of increase. Will continue to monitor at rehab

## 2010-12-30 ENCOUNTER — Other Ambulatory Visit: Payer: Self-pay | Admitting: Cardiology

## 2010-12-30 DIAGNOSIS — R21 Rash and other nonspecific skin eruption: Secondary | ICD-10-CM

## 2010-12-31 ENCOUNTER — Other Ambulatory Visit: Payer: Self-pay | Admitting: *Deleted

## 2010-12-31 NOTE — Telephone Encounter (Signed)
Refilled mycolog cream 

## 2011-01-06 ENCOUNTER — Telehealth: Payer: Self-pay | Admitting: *Deleted

## 2011-01-06 DIAGNOSIS — I493 Ventricular premature depolarization: Secondary | ICD-10-CM

## 2011-01-06 NOTE — Telephone Encounter (Signed)
Received EKG readings from [facility] on patient.  Patient having frequent PVC's, let Lori review.  Will obtain labs tomorrow instead of waiting until next week.  Keep office visit on 8/1.  Advised patient

## 2011-01-07 ENCOUNTER — Ambulatory Visit (INDEPENDENT_AMBULATORY_CARE_PROVIDER_SITE_OTHER): Payer: Medicare Other | Admitting: *Deleted

## 2011-01-07 DIAGNOSIS — Z951 Presence of aortocoronary bypass graft: Secondary | ICD-10-CM

## 2011-01-07 DIAGNOSIS — Z9889 Other specified postprocedural states: Secondary | ICD-10-CM

## 2011-01-07 DIAGNOSIS — I10 Essential (primary) hypertension: Secondary | ICD-10-CM

## 2011-01-07 DIAGNOSIS — Z7901 Long term (current) use of anticoagulants: Secondary | ICD-10-CM

## 2011-01-07 DIAGNOSIS — I2510 Atherosclerotic heart disease of native coronary artery without angina pectoris: Secondary | ICD-10-CM

## 2011-01-07 DIAGNOSIS — I493 Ventricular premature depolarization: Secondary | ICD-10-CM

## 2011-01-07 DIAGNOSIS — R0989 Other specified symptoms and signs involving the circulatory and respiratory systems: Secondary | ICD-10-CM

## 2011-01-07 DIAGNOSIS — R0600 Dyspnea, unspecified: Secondary | ICD-10-CM

## 2011-01-07 DIAGNOSIS — I4891 Unspecified atrial fibrillation: Secondary | ICD-10-CM

## 2011-01-07 DIAGNOSIS — I480 Paroxysmal atrial fibrillation: Secondary | ICD-10-CM

## 2011-01-07 DIAGNOSIS — Z954 Presence of other heart-valve replacement: Secondary | ICD-10-CM

## 2011-01-07 DIAGNOSIS — Z953 Presence of xenogenic heart valve: Secondary | ICD-10-CM

## 2011-01-07 DIAGNOSIS — I4949 Other premature depolarization: Secondary | ICD-10-CM

## 2011-01-07 LAB — CBC WITH DIFFERENTIAL/PLATELET
Basophils Relative: 1.4 % (ref 0.0–3.0)
Eosinophils Absolute: 0.3 10*3/uL (ref 0.0–0.7)
Eosinophils Relative: 4.2 % (ref 0.0–5.0)
Lymphocytes Relative: 29.4 % (ref 12.0–46.0)
MCHC: 33.8 g/dL (ref 30.0–36.0)
Monocytes Relative: 7.9 % (ref 3.0–12.0)
Neutrophils Relative %: 57.1 % (ref 43.0–77.0)
RBC: 4.59 Mil/uL (ref 3.87–5.11)
WBC: 6.4 10*3/uL (ref 4.5–10.5)

## 2011-01-07 LAB — BASIC METABOLIC PANEL
CO2: 27 mEq/L (ref 19–32)
Calcium: 9 mg/dL (ref 8.4–10.5)
Creatinine, Ser: 1.1 mg/dL (ref 0.4–1.2)
GFR: 54.05 mL/min — ABNORMAL LOW (ref >60.00)
Glucose, Bld: 95 mg/dL (ref 70–99)

## 2011-01-13 ENCOUNTER — Encounter: Payer: PPO | Admitting: Cardiology

## 2011-01-13 ENCOUNTER — Ambulatory Visit (INDEPENDENT_AMBULATORY_CARE_PROVIDER_SITE_OTHER): Payer: Medicare Other | Admitting: Cardiology

## 2011-01-13 ENCOUNTER — Other Ambulatory Visit: Payer: Medicare Other | Admitting: *Deleted

## 2011-01-13 ENCOUNTER — Encounter: Payer: Self-pay | Admitting: Cardiology

## 2011-01-13 VITALS — BP 140/80 | HR 64 | Wt 211.0 lb

## 2011-01-13 DIAGNOSIS — Z953 Presence of xenogenic heart valve: Secondary | ICD-10-CM

## 2011-01-13 DIAGNOSIS — Z951 Presence of aortocoronary bypass graft: Secondary | ICD-10-CM

## 2011-01-13 DIAGNOSIS — I10 Essential (primary) hypertension: Secondary | ICD-10-CM

## 2011-01-13 DIAGNOSIS — I4891 Unspecified atrial fibrillation: Secondary | ICD-10-CM

## 2011-01-13 DIAGNOSIS — I480 Paroxysmal atrial fibrillation: Secondary | ICD-10-CM

## 2011-01-13 DIAGNOSIS — Z954 Presence of other heart-valve replacement: Secondary | ICD-10-CM

## 2011-01-13 DIAGNOSIS — I2510 Atherosclerotic heart disease of native coronary artery without angina pectoris: Secondary | ICD-10-CM

## 2011-01-13 DIAGNOSIS — Z9889 Other specified postprocedural states: Secondary | ICD-10-CM

## 2011-01-13 MED ORDER — LOSARTAN POTASSIUM 100 MG PO TABS: 100.0000 mg | ORAL_TABLET | Freq: Every day | ORAL | Status: DC

## 2011-01-13 NOTE — Assessment & Plan Note (Signed)
The patient has a past history of paroxysmal atrial fibrillation.  She previously had been on Pradaxa.  She has remained in normal sinus rhythm and is no longer on Pradaxa and is taking a baby aspirin daily.

## 2011-01-13 NOTE — Assessment & Plan Note (Signed)
Her blood pressure was been running a little high.  On her previous visit she thought that the losartan was making her feel bad and so we stopped olmesartan.  In retrospect she does not think the losartan was causing any symptoms and since her blood pressure is borderline high we are going to restart losartan 100 mg daily

## 2011-01-13 NOTE — Assessment & Plan Note (Signed)
The patient has known ischemic heart disease and is status post CABG.  She underwent CABG and aortic valve replacement with a tissue valve on 01/10/09 by Dr. [name].  She subsequently was admitted to the hospital on 02/24/10 with chest pain and had acute inferior ST elevation myocardial infarction and was taken to the Cath Lab by Dr. Marquis Lunch.  He found that the right coronary artery was occluded and the distal portion and he performed an aspiration thrombectomy.  He noted that the left internal mammary artery graft was atraumatic.  The patient had a subsequent nuclear stress test on 04/14/10 which showed no ischemia and she was cleared thereby for orthopedic surgery with Dr. Simonne Come.  She has not been expressing any recurrent chest pain.  She has not had any symptoms of overt congestive heart failure although her B. Natruretic peptide has been in the range of the 165 previously.  She does sleep on 2 pillows but does not have edema or paroxysmal nocturnal dyspnea.  Her last chest x-ray on 09/2010 was negative for congestive heart failure.

## 2011-01-13 NOTE — Assessment & Plan Note (Signed)
The patient is not having any symptoms referable to her prosthetic aortic valve

## 2011-01-13 NOTE — Progress Notes (Signed)
Amber [name] Date of Birth:  Apr 17, 1936 [facility] [address]   [address] [place], [place]  [address] [phone]           Fax   [phone]  History of Present Illness: This pleasant 75 year old woman is seen for a scheduled followup office visit.  She has a history of ischemic heart disease and valvular heart disease.  She had coronary artery bypass graft surgery as well as a aortic valve replacement with a tissue valve by Dr. [name] on 01/10/09.  She had admission to the hospital on 02/24/10 with chest pain and inferior ST elevation myocardial infarction secondary to occlusion in the distal portion of the right coronary artery this was treated with aspiration thrombectomy by Dr. Eldridge Dace.She had a subsequent normal nuclear stress test on 04/14/10 no evidence of reversible ischemia.  Following that she was able to have successful left shoulder surgery by Dr. Nicholes Rough on 05/27/10 with a noncemented hemiarthroplasty of the left shoulder.  Current Outpatient Prescriptions  Medication Sig Dispense Refill  . amLODipine (NORVASC) 5 MG tablet Take 1 tablet (5 mg total) by mouth daily.  30 tablet    . aspirin 81 MG tablet Take 81 mg by mouth daily.        Marland Kitchen atorvastatin (LIPITOR) 40 MG tablet Take 40 mg by mouth daily.        . Calcium Carbonate-Vit D-Min (CALCIUM 1200 PO) Take by mouth daily.        . Cholecalciferol (VITAMIN D) 2000 UNITS CAPS Take by mouth.        . clopidogrel (PLAVIX) 75 MG tablet Take 75 mg by mouth daily.        . Cyanocobalamin (VITAMIN B-12 IJ) Inject 1,000 mcg as directed every 30 (thirty) days.        Marland Kitchen docusate sodium (COLACE) 100 MG capsule Take 1 capsule (100 mg total) by mouth daily.  10 capsule  0  . famotidine (PEPCID) 20 MG tablet Take 20 mg by mouth 2 (two) times daily.        . furosemide (LASIX) 20 MG tablet Take 2 tablets (40 mg total) by mouth daily.  60 tablet  5  . metoprolol tartrate (LOPRESSOR) 12.5 mg TABS Take 25 mg by  mouth as directed. One in the am and 1/2 at night      . nitroGLYCERIN (NITROSTAT) 0.4 MG SL tablet Place 0.4 mg under the tongue every 5 (five) minutes as needed.        . raloxifene (EVISTA) 60 MG tablet Take 60 mg by mouth daily.        Marland Kitchen losartan (COZAAR) 100 MG tablet Take 1 tablet (100 mg total) by mouth daily.  30 tablet  12    Allergies  Allergen Reactions  . Lisinopril Cough  . Metformin     nausea    Patient Active Problem List  Diagnoses  . CAD (coronary artery disease)  . PAF (paroxysmal atrial fibrillation)  . HTN (hypertension)  . Chronic anticoagulation  . Dyspnea  . Hx of CABG  . S/P aortic valve replacement with bioprosthetic valve    History  Smoking status  . Never Smoker   Smokeless tobacco  . Never Used    History  Alcohol Use No    Family History  Problem Relation Age of Onset  . Stroke Mother   . Stroke Father     Review of Systems: Constitutional: no fever chills diaphoresis or fatigue or  change in weight.  Head and neck: no hearing loss, no epistaxis, no photophobia or visual disturbance. Respiratory: No cough, shortness of breath or wheezing. Cardiovascular: No chest pain peripheral edema, palpitations. Gastrointestinal: No abdominal distention, no abdominal pain, no change in bowel habits hematochezia or melena. Genitourinary: No dysuria, no frequency, no urgency, no nocturia. Musculoskeletal:No arthralgias, no back pain, no gait disturbance or myalgias. Neurological: No dizziness, no headaches, no numbness, no seizures, no syncope, no weakness, no tremors. Hematologic: No lymphadenopathy, no easy bruising. Psychiatric: No confusion, no hallucinations, no sleep disturbance.    Physical Exam: Filed Vitals:   01/13/11 0934  BP: 140/80  Pulse: 64   The general appearance reveals a well-nourished woman in no distress.Pupils equal and reactive.   Extraocular Movements are full.  There is no scleral icterus.  The mouth and pharynx are  normal.  The neck is supple.  The carotids reveal no bruits.  The jugular venous pressure is normal.  The thyroid is not enlarged.  There is no lymphadenopathy.  The chest is clear to percussion and auscultation. There are no rales or rhonchi. Expansion of the chest is symmetrical.  The precordium is quiet.  The first heart sound is normal.  The second heart sound is physiologically split.  There is no murmur gallop rub or click.  There is no abnormal lift or heave.  The abdomen is soft and nontender. Bowel sounds are normal. The liver and spleen are not enlarged. There Are no abdominal masses. There are no bruits.    Normal extremity without phlebitis or edemaStrength is normal and symmetrical in all extremities.  There is no lateralizing weakness.  There are no sensory deficits.  The skin is warm and dry.  There is no rash.   Assessment / Plan:  The patient is to continue same medication.  She will continue her regular exercise program.  Work harder on weight loss

## 2011-01-28 ENCOUNTER — Other Ambulatory Visit: Payer: Self-pay | Admitting: Cardiology

## 2011-01-28 DIAGNOSIS — I119 Hypertensive heart disease without heart failure: Secondary | ICD-10-CM

## 2011-01-28 MED ORDER — METOPROLOL TARTRATE 25 MG PO TABS: 25.0000 mg | ORAL_TABLET | ORAL | Status: DC

## 2011-01-28 NOTE — Telephone Encounter (Signed)
Tried to call and couldn't hear patient

## 2011-01-28 NOTE — Telephone Encounter (Signed)
Called in needing some more Metoprolol refilled because she ran out and the next refill date is on 02/04/11. Please call back. I have pulled her chart.

## 2011-01-28 NOTE — Telephone Encounter (Signed)
Called to pharmacy as requested  

## 2011-02-17 ENCOUNTER — Other Ambulatory Visit: Payer: Self-pay | Admitting: Family Medicine

## 2011-02-17 DIAGNOSIS — Z7901 Long term (current) use of anticoagulants: Secondary | ICD-10-CM

## 2011-02-17 DIAGNOSIS — I10 Essential (primary) hypertension: Secondary | ICD-10-CM

## 2011-02-17 DIAGNOSIS — I480 Paroxysmal atrial fibrillation: Secondary | ICD-10-CM

## 2011-02-17 DIAGNOSIS — Z951 Presence of aortocoronary bypass graft: Secondary | ICD-10-CM

## 2011-02-17 DIAGNOSIS — I2510 Atherosclerotic heart disease of native coronary artery without angina pectoris: Secondary | ICD-10-CM

## 2011-02-17 DIAGNOSIS — I509 Heart failure, unspecified: Secondary | ICD-10-CM

## 2011-02-17 DIAGNOSIS — R0600 Dyspnea, unspecified: Secondary | ICD-10-CM

## 2011-02-17 DIAGNOSIS — R0602 Shortness of breath: Secondary | ICD-10-CM

## 2011-03-04 LAB — URINALYSIS, ROUTINE W REFLEX MICROSCOPIC
Bilirubin Urine: NEGATIVE
Hgb urine dipstick: NEGATIVE
Ketones, ur: NEGATIVE
Nitrite: NEGATIVE
Urobilinogen, UA: 1
pH: 5.5

## 2011-03-04 LAB — CBC
HCT: 30.8 — ABNORMAL LOW
Hemoglobin: 10.7 — ABNORMAL LOW
RBC: 3.61 — ABNORMAL LOW
RDW: 14.6

## 2011-03-04 LAB — BASIC METABOLIC PANEL
Calcium: 9.2
GFR calc Af Amer: 50 — ABNORMAL LOW
GFR calc non Af Amer: 41 — ABNORMAL LOW
Glucose, Bld: 157 — ABNORMAL HIGH
Potassium: 4.1
Sodium: 139

## 2011-03-04 LAB — DIFFERENTIAL
Basophils Absolute: 0.1
Basophils Relative: 1
Eosinophils Absolute: 0.1
Eosinophils Relative: 1
Lymphs Abs: 1.3
Neutrophils Relative %: 82 — ABNORMAL HIGH

## 2011-03-04 LAB — URINE MICROSCOPIC-ADD ON

## 2011-03-08 LAB — URINE MICROSCOPIC-ADD ON

## 2011-03-08 LAB — CBC
HCT: 28.6 — ABNORMAL LOW
Hemoglobin: 11.6 — ABNORMAL LOW
Hemoglobin: 9.9 — ABNORMAL LOW
MCHC: 35
Platelets: 346
RBC: 3.37 — ABNORMAL LOW
RBC: 4
RDW: 15.5
RDW: 15.6 — ABNORMAL HIGH
WBC: 6.9

## 2011-03-08 LAB — BASIC METABOLIC PANEL
CO2: 24
Calcium: 8.4
Calcium: 9.1
Calcium: 9.1
Creatinine, Ser: 1.05
Creatinine, Ser: 1.36 — ABNORMAL HIGH
GFR calc Af Amer: 47 — ABNORMAL LOW
GFR calc Af Amer: >60
GFR calc non Af Amer: 39 — ABNORMAL LOW
GFR calc non Af Amer: 52 — ABNORMAL LOW
GFR calc non Af Amer: 54 — ABNORMAL LOW
Glucose, Bld: 120 — ABNORMAL HIGH
Potassium: 4.5
Sodium: 134 — ABNORMAL LOW
Sodium: 136
Sodium: 136

## 2011-03-08 LAB — URINALYSIS, ROUTINE W REFLEX MICROSCOPIC
Bilirubin Urine: NEGATIVE
Hgb urine dipstick: NEGATIVE
Nitrite: NEGATIVE
Specific Gravity, Urine: 1.019
Urobilinogen, UA: 1
pH: 5.5

## 2011-03-08 LAB — DIFFERENTIAL
Basophils Absolute: 0
Basophils Relative: 1
Lymphocytes Relative: 35
Monocytes Absolute: 0.5
Monocytes Relative: 9
Neutro Abs: 2.7
Neutrophils Relative %: 53

## 2011-03-18 ENCOUNTER — Other Ambulatory Visit: Payer: Self-pay | Admitting: Cardiology

## 2011-03-19 NOTE — Telephone Encounter (Signed)
Refilled B12

## 2011-03-23 ENCOUNTER — Other Ambulatory Visit: Payer: Self-pay | Admitting: Cardiovascular Disease

## 2011-03-24 ENCOUNTER — Other Ambulatory Visit: Payer: Self-pay | Admitting: *Deleted

## 2011-03-24 MED ORDER — CLOPIDOGREL BISULFATE 75 MG PO TABS: 75.0000 mg | ORAL_TABLET | Freq: Every day | ORAL | Status: DC

## 2011-03-24 NOTE — Telephone Encounter (Signed)
Fax Received. Refill Completed. Amber [name] (M.A)  

## 2011-03-25 ENCOUNTER — Other Ambulatory Visit: Payer: Self-pay | Admitting: *Deleted

## 2011-03-25 ENCOUNTER — Telehealth: Payer: Self-pay | Admitting: Cardiology

## 2011-03-25 DIAGNOSIS — F419 Anxiety disorder, unspecified: Secondary | ICD-10-CM

## 2011-03-25 MED ORDER — ALPRAZOLAM 0.25 MG PO TABS: 0.2500 mg | ORAL_TABLET | Freq: Every evening | ORAL | Status: AC | PRN

## 2011-03-25 NOTE — Telephone Encounter (Signed)
Refilled meds per fax request.  

## 2011-03-25 NOTE — Telephone Encounter (Signed)
Pharmacy calling in refill for zanax 0.25mg  1 tablet 2x/day.

## 2011-03-29 NOTE — Telephone Encounter (Signed)
Spoke with patient and she has received refills, sent to pharmacy last week

## 2011-04-14 ENCOUNTER — Other Ambulatory Visit: Payer: Self-pay | Admitting: Cardiology

## 2011-04-14 NOTE — Telephone Encounter (Signed)
Refilled prilosec.

## 2011-04-15 ENCOUNTER — Other Ambulatory Visit: Payer: Self-pay | Admitting: Cardiology

## 2011-04-16 ENCOUNTER — Other Ambulatory Visit (INDEPENDENT_AMBULATORY_CARE_PROVIDER_SITE_OTHER): Payer: Medicare Other | Admitting: *Deleted

## 2011-04-16 ENCOUNTER — Other Ambulatory Visit: Payer: Medicare Other | Admitting: *Deleted

## 2011-04-16 ENCOUNTER — Encounter: Payer: Self-pay | Admitting: Cardiology

## 2011-04-16 ENCOUNTER — Ambulatory Visit (INDEPENDENT_AMBULATORY_CARE_PROVIDER_SITE_OTHER): Payer: Medicare Other | Admitting: Cardiology

## 2011-04-16 VITALS — BP 120/70 | HR 78 | Ht 65.0 in | Wt 215.0 lb

## 2011-04-16 DIAGNOSIS — Z9889 Other specified postprocedural states: Secondary | ICD-10-CM

## 2011-04-16 DIAGNOSIS — Z951 Presence of aortocoronary bypass graft: Secondary | ICD-10-CM

## 2011-04-16 DIAGNOSIS — Z7901 Long term (current) use of anticoagulants: Secondary | ICD-10-CM

## 2011-04-16 DIAGNOSIS — I10 Essential (primary) hypertension: Secondary | ICD-10-CM

## 2011-04-16 DIAGNOSIS — I119 Hypertensive heart disease without heart failure: Secondary | ICD-10-CM

## 2011-04-16 DIAGNOSIS — I4891 Unspecified atrial fibrillation: Secondary | ICD-10-CM

## 2011-04-16 DIAGNOSIS — I2510 Atherosclerotic heart disease of native coronary artery without angina pectoris: Secondary | ICD-10-CM

## 2011-04-16 DIAGNOSIS — I509 Heart failure, unspecified: Secondary | ICD-10-CM

## 2011-04-16 DIAGNOSIS — R0609 Other forms of dyspnea: Secondary | ICD-10-CM

## 2011-04-16 DIAGNOSIS — Z952 Presence of prosthetic heart valve: Secondary | ICD-10-CM

## 2011-04-16 DIAGNOSIS — I359 Nonrheumatic aortic valve disorder, unspecified: Secondary | ICD-10-CM

## 2011-04-16 DIAGNOSIS — I480 Paroxysmal atrial fibrillation: Secondary | ICD-10-CM

## 2011-04-16 DIAGNOSIS — Z953 Presence of xenogenic heart valve: Secondary | ICD-10-CM

## 2011-04-16 DIAGNOSIS — R0602 Shortness of breath: Secondary | ICD-10-CM

## 2011-04-16 DIAGNOSIS — R0600 Dyspnea, unspecified: Secondary | ICD-10-CM

## 2011-04-16 DIAGNOSIS — R197 Diarrhea, unspecified: Secondary | ICD-10-CM | POA: Insufficient documentation

## 2011-04-16 LAB — LIPID PANEL
HDL: 63.7 mg/dL (ref >39.00)
LDL Cholesterol: 60 mg/dL (ref 0–99)
Total CHOL/HDL Ratio: 2
Triglycerides: 99 mg/dL (ref 0.0–149.0)
VLDL: 19.8 mg/dL (ref 0.0–40.0)

## 2011-04-16 LAB — HEPATIC FUNCTION PANEL
Albumin: 3.7 g/dL (ref 3.5–5.2)
Bilirubin, Direct: 0 mg/dL (ref 0.0–0.3)
Total Protein: 7.2 g/dL (ref 6.0–8.3)

## 2011-04-16 LAB — BASIC METABOLIC PANEL
CO2: 24 mEq/L (ref 19–32)
Calcium: 8.9 mg/dL (ref 8.4–10.5)
Creatinine, Ser: 1.2 mg/dL (ref 0.4–1.2)
GFR: 47.72 mL/min — ABNORMAL LOW (ref >60.00)
Sodium: 139 mEq/L (ref 135–145)

## 2011-04-16 NOTE — Progress Notes (Signed)
Amber [name] Date of Birth:  [birth year] [facility] / [facility] [address]   [address] [place], [place]  [address] [phone]           Fax   [phone]  History of Present Illness: This pleasant 75 year old woman is seen for a scheduled 4 month followup office visit.  Has a complex past medical history.  She has a history of paroxysmal atrial fibrillation.  She is previously on production, but has remained in normal sinus rhythm and is now on aspirin and Plavix.  She has a history of coronary disease and had coronary artery bypass graft surgery, as well as aortic valve replacement with a tissue valve on 01/10/09.  Dr. [name] was her surgeon to visit.  Has history of essential hypertension.  Current Outpatient Prescriptions  Medication Sig Dispense Refill  . ALPRAZolam (XANAX) 0.25 MG tablet Take 1 tablet (0.25 mg total) by mouth at bedtime as needed.  60 tablet  5  . amLODipine (NORVASC) 5 MG tablet Take 1 tablet (5 mg total) by mouth daily.  30 tablet    . aspirin 81 MG tablet Take 81 mg by mouth daily.        Marland Kitchen atorvastatin (LIPITOR) 40 MG tablet TAKE 1 TABLET BY MOUTH EVERY DAY  30 tablet  6  . Calcium Carbonate-Vit D-Min (CALCIUM 1200 PO) Take by mouth daily.        . Cholecalciferol (VITAMIN D) 2000 UNITS CAPS Take by mouth.        . clopidogrel (PLAVIX) 75 MG tablet Take 1 tablet (75 mg total) by mouth daily.  90 tablet  1  . cyanocobalamin (,VITAMIN B-12,) 1000 MCG/ML injection INJECT 1CC EACH MONTH AS DIRECTED  10 mL  0  . docusate sodium (COLACE) 100 MG capsule Take 1 capsule (100 mg total) by mouth daily.  10 capsule  0  . famotidine (PEPCID) 20 MG tablet TAKE 1 TABLET BY MOUTH TWICE A DAY  60 tablet  12  . furosemide (LASIX) 20 MG tablet Take 2 tablets (40 mg total) by mouth daily.  60 tablet  5  . losartan (COZAAR) 100 MG tablet Take 1 tablet (100 mg total) by mouth daily.  30 tablet  12  . metoprolol tartrate (LOPRESSOR) 25 MG tablet Take 1 tablet (25 mg  total) by mouth as directed. One in the am and 1/2 at night  135 tablet  3  . nitroGLYCERIN (NITROSTAT) 0.4 MG SL tablet Place 0.4 mg under the tongue every 5 (five) minutes as needed.        . raloxifene (EVISTA) 60 MG tablet Take 60 mg by mouth daily.        Marland Kitchen DISCONTD: Cyanocobalamin (VITAMIN B-12 IJ) Inject 1,000 mcg as directed every 30 (thirty) days.          Allergies  Allergen Reactions  . Lisinopril Cough  . Metformin     nausea    Patient Active Problem List  Diagnoses  . CAD (coronary artery disease)  . PAF (paroxysmal atrial fibrillation)  . HTN (hypertension)  . Chronic anticoagulation  . Dyspnea  . Hx of CABG  . S/P aortic valve replacement with bioprosthetic valve    History  Smoking status  . Never Smoker   Smokeless tobacco  . Never Used    History  Alcohol Use No    Family History  Problem Relation Age of Onset  . Stroke Mother   . Stroke Father   .  Heart disease Brother   . Heart disease Brother     Review of Systems: Constitutional: no fever chills diaphoresis or fatigue or change in weight.  Head and neck: no hearing loss, no epistaxis, no photophobia or visual disturbance. Respiratory: No cough, shortness of breath or wheezing. Cardiovascular: No chest pain peripheral edema, palpitations. Gastrointestinal: No abdominal distention, no abdominal pain, no change in bowel habits hematochezia or melena. Genitourinary: No dysuria, no frequency, no urgency, no nocturia. Musculoskeletal:No arthralgias, no back pain, no gait disturbance or myalgias. Neurological: No dizziness, no headaches, no numbness, no seizures, no syncope, no weakness, no tremors. Hematologic: No lymphadenopathy, no easy bruising. Psychiatric: No confusion, no hallucinations, no sleep disturbance.    Physical Exam: Filed Vitals:   04/16/11 0938  BP: 120/70  Pulse: 78   general appearance reveals a well-developed, well-nourished woman in no distress.Pupils equal and  reactive.   Extraocular Movements are full.  There is no scleral icterus.  The mouth and pharynx are normal.  The neck is supple.  The carotids reveal no bruits.  The jugular venous pressure is normal.  The thyroid is not enlarged.  There is no lymphadenopathy.  The chest is clear to percussion and auscultation. There are no rales or rhonchi. Expansion of the chest is symmetrical.  Heart reveals a soft systolic ejection murmur at the base.  No diastolic murmur.  No gallop or rub.The abdomen is soft and nontender. Bowel sounds are normal. The liver and spleen are not enlarged. There Are no abdominal masses. There are no bruits.  The pedal pulses are good.  There is no phlebitis or edema.  There is no cyanosis or clubbing. Strength is normal and symmetrical in all extremities.  There is no lateralizing weakness.  There are no sensory deficits.  The skin is warm and dry.  There is no rash.  EKG shows normal sinus rhythm and no ischemic changes   Assessment / Plan: Continue present regimen.  Recheck in 4 months for followup office visit CBC, lipid panel, hepatic function panel, and BMET.  Work harder at [employer] loss and regular exercise

## 2011-04-16 NOTE — Patient Instructions (Signed)
Will obtain labs today and call you with results Increase walking and work harder on diet and weight loss  Your physician recommends that you continue on your current medications as directed. Please refer to the Current Medication list given to you today.  Your physician recommends that you schedule a follow-up appointment in: 4 months

## 2011-04-16 NOTE — Assessment & Plan Note (Signed)
The patient has not been having any chills, or evidence of infection.  No symptoms referable to her prosthetic valve.

## 2011-04-16 NOTE — Assessment & Plan Note (Signed)
The patient has had a history of loose stools, which follow meals.  She has difficulty going out to eat with friends because of a hyperactive gastrocolic reflex.  After eating out with friends in a restaurant she will have to defecate urgently within 30 minute.  She has not tried taking anything like Lomotil or Imodium prior to eating.  She does have an appointment coming up with her gastroenterologist, Dr. Dorena Cookey.  It is time for her routine colonoscopy.  She will discuss these symptoms with him.

## 2011-04-16 NOTE — Assessment & Plan Note (Signed)
Patient remains in normal sinus rhythm.  No awareness of any palpitations or tachycardia.  She is no longer on Pradaxa

## 2011-04-16 NOTE — Assessment & Plan Note (Signed)
The patient has not been having any recurrent chest pain or angina.  She is not yet back and her maintenance exercise program in [place].  She is trying to walk on her own.  Her electrocardiogram today shows normal sinus rhythm and no ischemic changes.

## 2011-04-16 NOTE — Assessment & Plan Note (Signed)
The patient has not been on any symptoms of congestive heart failure.  She denies any dizziness or syncope.  Headaches or dizzy spells.

## 2011-04-20 ENCOUNTER — Telehealth: Payer: Self-pay | Admitting: *Deleted

## 2011-04-20 NOTE — Telephone Encounter (Signed)
Advised of labs 

## 2011-04-20 NOTE — Telephone Encounter (Signed)
Message copied by Burnell Blanks on Tue Apr 20, 2011  5:34 PM ------      Message from: Cassell Clement      Created: Sun Apr 18, 2011  5:42 PM       The current medical regimen is effective;  continue present plan and medications.      Labs good.

## 2011-05-14 ENCOUNTER — Other Ambulatory Visit: Payer: Self-pay | Admitting: Cardiology

## 2011-05-14 NOTE — Telephone Encounter (Signed)
Refilled furosemide

## 2011-05-18 ENCOUNTER — Telehealth: Payer: Self-pay | Admitting: *Deleted

## 2011-05-18 NOTE — Telephone Encounter (Signed)
Received fax from Dr Madilyn Fireman office regarding holding Plavix.  Per Dr Patty Sermons ok to hold 5 days prior to procedure and 3 days after if polyps removed.  Advised patient and faxed back

## 2011-05-27 ENCOUNTER — Other Ambulatory Visit: Payer: Self-pay | Admitting: Gastroenterology

## 2011-07-28 ENCOUNTER — Other Ambulatory Visit: Payer: Self-pay | Admitting: *Deleted

## 2011-07-28 MED ORDER — AMLODIPINE BESYLATE 5 MG PO TABS: 5.0000 mg | ORAL_TABLET | Freq: Every day | ORAL | Status: DC

## 2011-08-23 ENCOUNTER — Other Ambulatory Visit: Payer: Medicare Other

## 2011-08-23 ENCOUNTER — Ambulatory Visit (INDEPENDENT_AMBULATORY_CARE_PROVIDER_SITE_OTHER): Payer: Medicare Other | Admitting: Cardiology

## 2011-08-23 ENCOUNTER — Encounter: Payer: Self-pay | Admitting: Cardiology

## 2011-08-23 VITALS — BP 110/72 | HR 80 | Ht 65.0 in | Wt 217.0 lb

## 2011-08-23 DIAGNOSIS — Z953 Presence of xenogenic heart valve: Secondary | ICD-10-CM

## 2011-08-23 DIAGNOSIS — Z954 Presence of other heart-valve replacement: Secondary | ICD-10-CM

## 2011-08-23 DIAGNOSIS — Z951 Presence of aortocoronary bypass graft: Secondary | ICD-10-CM

## 2011-08-23 DIAGNOSIS — Z952 Presence of prosthetic heart valve: Secondary | ICD-10-CM

## 2011-08-23 DIAGNOSIS — I119 Hypertensive heart disease without heart failure: Secondary | ICD-10-CM

## 2011-08-23 DIAGNOSIS — I480 Paroxysmal atrial fibrillation: Secondary | ICD-10-CM

## 2011-08-23 DIAGNOSIS — I359 Nonrheumatic aortic valve disorder, unspecified: Secondary | ICD-10-CM

## 2011-08-23 DIAGNOSIS — I2510 Atherosclerotic heart disease of native coronary artery without angina pectoris: Secondary | ICD-10-CM

## 2011-08-23 DIAGNOSIS — E7800 Pure hypercholesterolemia, unspecified: Secondary | ICD-10-CM

## 2011-08-23 DIAGNOSIS — I4891 Unspecified atrial fibrillation: Secondary | ICD-10-CM

## 2011-08-23 LAB — HEPATIC FUNCTION PANEL
AST: 20 U/L (ref 0–37)
Albumin: 3.8 g/dL (ref 3.5–5.2)
Alkaline Phosphatase: 114 U/L (ref 39–117)
Total Protein: 7.4 g/dL (ref 6.0–8.3)

## 2011-08-23 LAB — LIPID PANEL
Cholesterol: 156 mg/dL (ref 0–200)
HDL: 62 mg/dL (ref >39.00)
LDL Cholesterol: 77 mg/dL (ref 0–99)
Triglycerides: 84 mg/dL (ref 0.0–149.0)

## 2011-08-23 LAB — CBC WITH DIFFERENTIAL/PLATELET
Basophils Relative: 0.7 % (ref 0.0–3.0)
Eosinophils Relative: 2.2 % (ref 0.0–5.0)
Lymphocytes Relative: 18.3 % (ref 12.0–46.0)
Monocytes Relative: 4.8 % (ref 3.0–12.0)
Neutrophils Relative %: 74 % (ref 43.0–77.0)
RBC: 4.6 Mil/uL (ref 3.87–5.11)
WBC: 8.4 10*3/uL (ref 4.5–10.5)

## 2011-08-23 LAB — BASIC METABOLIC PANEL
Calcium: 9.1 mg/dL (ref 8.4–10.5)
Creatinine, Ser: 1.4 mg/dL — ABNORMAL HIGH (ref 0.4–1.2)
Sodium: 136 mEq/L (ref 135–145)

## 2011-08-23 MED ORDER — AMIODARONE HCL 200 MG PO TABS: 200.0000 mg | ORAL_TABLET | Freq: Two times a day (BID) | ORAL | Status: DC

## 2011-08-23 MED ORDER — DABIGATRAN ETEXILATE MESYLATE 150 MG PO CAPS: 150.0000 mg | ORAL_CAPSULE | Freq: Two times a day (BID) | ORAL | Status: DC

## 2011-08-23 NOTE — Assessment & Plan Note (Signed)
She denies any headaches or dizzy spells or palpitations.  He has not had any TIA symptoms.

## 2011-08-23 NOTE — Assessment & Plan Note (Signed)
The patient has not been experiencing any recurrent chest pain or angina. 

## 2011-08-23 NOTE — Patient Instructions (Signed)
Will stop Plavix  Start Pradax 150 mg twice a day Start Amiodarone 200 mg twice a day Your physician recommends that you schedule a follow-up appointment in: 1 month ov/ekg/cbc/hfp/bmet/tsh Will obtain labs today and call you with the results

## 2011-08-23 NOTE — Assessment & Plan Note (Signed)
EKG today shows that she is back in atrial fibrillation with a controlled ventricular response.  She herself was unaware that she was in atrial fibrillation.  She does not feel any differently when she is in atrial fibrillation.  We will stop her Plavix at this point and restart Pradaxa 150 mg twice a day and she will stay on a daily aspirin 81 mg daily.  In an effort to nudge her back to normal sinus rhythm we will also add amiodarone 200 mg twice a day.  We will have her return in one month for followup office visit and EKG and consider cardioversion at that point if she is still in atrial fibrillation.

## 2011-08-23 NOTE — Progress Notes (Signed)
Amber [name] Date of Birth:  27-Apr-1936 [facility] [address] [place], [place]  [address] [phone]         Fax   [phone]  History of Present Illness: This pleasant 77 year old woman is seen for a scheduled four-month followup office visit.  She has a past history of paroxysmal atrial fibrillation.  When we saw her in November 2012 she was in normal sinus rhythm.  She has been on aspirin and Plavix.  She is status post aortic valve replacement and has a bioprosthetic valve.  She also has a history of ischemic heart disease and is status post CABG.  Her open-heart surgery was done on 01/10/09.  As her last saw her she's been doing well with no increased shortness of breath.  She's had no chest pain.  She had a colonoscopy which was unremarkable by Dr. Madilyn Fireman.  She has not been able to get to her cardiac rehabilitation classes because her car is broken and she does not have a ride.  Current Outpatient Prescriptions  Medication Sig Dispense Refill  . amLODipine (NORVASC) 5 MG tablet Take 1 tablet (5 mg total) by mouth daily.  30 tablet  6  . aspirin 81 MG tablet Take 81 mg by mouth daily.        Marland Kitchen atorvastatin (LIPITOR) 40 MG tablet TAKE 1 TABLET BY MOUTH EVERY DAY  30 tablet  6  . Calcium Carbonate-Vit D-Min (CALCIUM 1200 PO) Take by mouth daily.        . Cholecalciferol (VITAMIN D) 2000 UNITS CAPS Take by mouth.        . cyanocobalamin (,VITAMIN B-12,) 1000 MCG/ML injection INJECT 1CC EACH MONTH AS DIRECTED  10 mL  0  . docusate sodium (COLACE) 100 MG capsule Take 100 mg by mouth daily. As needed  10 capsule  0  . famotidine (PEPCID) 20 MG tablet TAKE 1 TABLET BY MOUTH TWICE A DAY  60 tablet  12  . furosemide (LASIX) 20 MG tablet TAKE 2 TABLETS BY MOUTH EVERY DAY  60 tablet  5  . losartan (COZAAR) 100 MG tablet Take 1 tablet (100 mg total) by mouth daily.  30 tablet  12  . metoprolol tartrate (LOPRESSOR) 25 MG tablet Take 1 tablet (25 mg total) by mouth as  directed. One in the am and 1/2 at night  135 tablet  3  . nitroGLYCERIN (NITROSTAT) 0.4 MG SL tablet Place 0.4 mg under the tongue every 5 (five) minutes as needed.        . raloxifene (EVISTA) 60 MG tablet Take 60 mg by mouth daily.        Marland Kitchen amiodarone (PACERONE) 200 MG tablet Take 1 tablet (200 mg total) by mouth 2 (two) times daily.  60 tablet  12  . dabigatran (PRADAXA) 150 MG CAPS Take 1 capsule (150 mg total) by mouth every 12 (twelve) hours.  60 capsule  12    Allergies  Allergen Reactions  . Lisinopril Cough  . Metformin     nausea    Patient Active Problem List  Diagnoses  . CAD (coronary artery disease)  . PAF (paroxysmal atrial fibrillation)  . HTN (hypertension)  . Chronic anticoagulation  . Dyspnea  . Hx of CABG  . S/P aortic valve replacement with bioprosthetic valve  . Diarrhea    History  Smoking status  . Never Smoker   Smokeless tobacco  . Never Used    History  Alcohol Use No  Family History  Problem Relation Age of Onset  . Stroke Mother   . Stroke Father   . Heart disease Brother   . Heart disease Brother     Review of Systems: Constitutional: no fever chills diaphoresis or fatigue or change in weight.  Head and neck: no hearing loss, no epistaxis, no photophobia or visual disturbance. Respiratory: No cough, shortness of breath or wheezing. Cardiovascular: No chest pain peripheral edema, palpitations. Gastrointestinal: No abdominal distention, no abdominal pain, no change in bowel habits hematochezia or melena. Genitourinary: No dysuria, no frequency, no urgency, no nocturia. Musculoskeletal:No arthralgias, no back pain, no gait disturbance or myalgias. Neurological: No dizziness, no headaches, no numbness, no seizures, no syncope, no weakness, no tremors. Hematologic: No lymphadenopathy, no easy bruising. Psychiatric: No confusion, no hallucinations, no sleep disturbance.    Physical Exam: Filed Vitals:   08/23/11 0902  BP: 110/72   Pulse: 80   the general appearance reveals a well-developed well-nourished woman in no distress.Pupils equal and reactive.   Extraocular Movements are full.  There is no scleral icterus.  The mouth and pharynx are normal.  The neck is supple.  The carotids reveal no bruits.  The jugular venous pressure is normal.  The thyroid is not enlarged.  There is no lymphadenopathy.  The chest is clear to percussion and auscultation. There are no rales or rhonchi. Expansion of the chest is symmetrical.  The precordium is quiet.  The first heart sound is normal.  The second heart sound is physiologically split.  There is no murmur gallop rub or click.  There is no abnormal lift or heave.  The rhythm is irregularThe abdomen is soft and nontender. Bowel sounds are normal. The liver and spleen are not enlarged. There Are no abdominal masses. There are no bruits.  The pedal pulses are good.  There is no phlebitis or edema.  There is no cyanosis or clubbing. Strength is normal and symmetrical in all extremities.  There is no lateralizing weakness.  There are no sensory deficits.  The skin is warm and dry.  There is no rash.   EKG shows atrial fibrillation with a controlled ventricular response.  There are no ischemic changes.  The atrial fibrillation is new since 04/16/11  Assessment / Plan:  Restart anticoagulation and return in one month for followup office visit and EKG.  Also check lab work at that time including CBC hepatic function panel nasal metabolic panel and TSH.  Consider cardioversion.  We are starting amiodarone 200 mg twice a day

## 2011-09-03 ENCOUNTER — Telehealth: Payer: Self-pay | Admitting: *Deleted

## 2011-09-03 NOTE — Telephone Encounter (Signed)
Advised patient labs ok per  Dr. Patty Sermons reviewing

## 2011-10-11 ENCOUNTER — Ambulatory Visit (INDEPENDENT_AMBULATORY_CARE_PROVIDER_SITE_OTHER): Payer: Medicare Other | Admitting: Cardiology

## 2011-10-11 ENCOUNTER — Encounter: Payer: Self-pay | Admitting: Cardiology

## 2011-10-11 ENCOUNTER — Other Ambulatory Visit: Payer: Medicare Other

## 2011-10-11 VITALS — BP 130/76 | HR 48 | Ht 65.0 in | Wt 219.0 lb

## 2011-10-11 DIAGNOSIS — E7800 Pure hypercholesterolemia, unspecified: Secondary | ICD-10-CM

## 2011-10-11 DIAGNOSIS — I4891 Unspecified atrial fibrillation: Secondary | ICD-10-CM

## 2011-10-11 DIAGNOSIS — I2510 Atherosclerotic heart disease of native coronary artery without angina pectoris: Secondary | ICD-10-CM

## 2011-10-11 DIAGNOSIS — I119 Hypertensive heart disease without heart failure: Secondary | ICD-10-CM

## 2011-10-11 DIAGNOSIS — J40 Bronchitis, not specified as acute or chronic: Secondary | ICD-10-CM | POA: Insufficient documentation

## 2011-10-11 DIAGNOSIS — R079 Chest pain, unspecified: Secondary | ICD-10-CM

## 2011-10-11 DIAGNOSIS — E538 Deficiency of other specified B group vitamins: Secondary | ICD-10-CM | POA: Insufficient documentation

## 2011-10-11 DIAGNOSIS — I480 Paroxysmal atrial fibrillation: Secondary | ICD-10-CM

## 2011-10-11 LAB — HEPATIC FUNCTION PANEL
AST: 19 U/L (ref 0–37)
Albumin: 3.6 g/dL (ref 3.5–5.2)

## 2011-10-11 LAB — CBC WITH DIFFERENTIAL/PLATELET
Basophils Relative: 0.4 % (ref 0.0–3.0)
Eosinophils Absolute: 0.2 10*3/uL (ref 0.0–0.7)
HCT: 38 % (ref 36.0–46.0)
Hemoglobin: 12.8 g/dL (ref 12.0–15.0)
Lymphocytes Relative: 22.2 % (ref 12.0–46.0)
MCHC: 33.6 g/dL (ref 30.0–36.0)
MCV: 87.4 fl (ref 78.0–100.0)
Monocytes Absolute: 0.2 10*3/uL (ref 0.1–1.0)
Neutro Abs: 5.5 10*3/uL (ref 1.4–7.7)
RBC: 4.35 Mil/uL (ref 3.87–5.11)

## 2011-10-11 LAB — BASIC METABOLIC PANEL
BUN: 26 mg/dL — ABNORMAL HIGH (ref 6–23)
Creatinine, Ser: 1.5 mg/dL — ABNORMAL HIGH (ref 0.4–1.2)
GFR: 37.57 mL/min — ABNORMAL LOW (ref >60.00)
Glucose, Bld: 111 mg/dL — ABNORMAL HIGH (ref 70–99)
Potassium: 3.8 mEq/L (ref 3.5–5.1)

## 2011-10-11 LAB — TSH: TSH: 2.84 u[IU]/mL (ref 0.35–5.50)

## 2011-10-11 MED ORDER — NITROGLYCERIN 0.4 MG SL SUBL: 0.4000 mg | SUBLINGUAL_TABLET | SUBLINGUAL | Status: DC | PRN

## 2011-10-11 MED ORDER — AZITHROMYCIN 250 MG PO TABS: ORAL_TABLET | ORAL | Status: AC

## 2011-10-11 MED ORDER — CYANOCOBALAMIN 1000 MCG/ML IJ SOLN: 1000.0000 ug | INTRAMUSCULAR | Status: DC

## 2011-10-11 NOTE — Assessment & Plan Note (Signed)
The patient has had symptoms of recent acute bronchitis with yellowish sputum which is difficult to expectorate.  No fever or chills.  Cough has been persistent and we are going to treat this with Zithromax Z-Pak and Mucinex plain.

## 2011-10-11 NOTE — Assessment & Plan Note (Signed)
The patient has not been experiencing any recurrent angina pectoris.  She does like to keep some sublingual nitroglycerin on hand and we gave her a new prescription today

## 2011-10-11 NOTE — Progress Notes (Signed)
Amber [name] Date of Birth:  16-Sep-1935 [facility] [address] [place], [place]  [address] [phone]         Fax   [phone]  History of Present Illness: This pleasant 76 year old woman is seen for a scheduled followup office visit.  She has a past history of ischemic heart disease and aortic stenosis.  She is status post CABG and status post aortic valve replacement with a bioprosthetic valve.  Her surgery was done on 01/10/09.  She has had a past history of paroxysmal atrial fibrillation.  Presently she is maintaining normal sinus rhythm on amiodarone 200 mg twice a day.  She is on Pradaxa for her anticoagulation.  Current Outpatient Prescriptions  Medication Sig Dispense Refill  . amiodarone (PACERONE) 200 MG tablet Take 1 tablet (200 mg total) by mouth 2 (two) times daily.  60 tablet  12  . aspirin 81 MG tablet Take 81 mg by mouth daily.        Marland Kitchen atorvastatin (LIPITOR) 40 MG tablet TAKE 1 TABLET BY MOUTH EVERY DAY  30 tablet  6  . Calcium Carbonate-Vit D-Min (CALCIUM 1200 PO) Take by mouth daily.        . Cholecalciferol (VITAMIN D) 2000 UNITS CAPS Take by mouth.        . cyanocobalamin (,VITAMIN B-12,) 1000 MCG/ML injection Inject 1 mL (1,000 mcg total) into the muscle every 30 (thirty) days.  30 mL  1  . dabigatran (PRADAXA) 150 MG CAPS Take 1 capsule (150 mg total) by mouth every 12 (twelve) hours.  60 capsule  12  . docusate sodium (COLACE) 100 MG capsule Take 100 mg by mouth daily. As needed  10 capsule  0  . famotidine (PEPCID) 20 MG tablet TAKE 1 TABLET BY MOUTH TWICE A DAY  60 tablet  12  . furosemide (LASIX) 20 MG tablet TAKE 2 TABLETS BY MOUTH EVERY DAY  60 tablet  5  . losartan (COZAAR) 100 MG tablet Take 1 tablet (100 mg total) by mouth daily.  30 tablet  12  . metoprolol tartrate (LOPRESSOR) 25 MG tablet Take 25 mg by mouth. 1/2 tablet twice a day      . nitroGLYCERIN (NITROSTAT) 0.4 MG SL tablet Place 1 tablet (0.4 mg total) under the tongue  every 5 (five) minutes as needed.  25 tablet  prn  . raloxifene (EVISTA) 60 MG tablet Take 60 mg by mouth daily.        Marland Kitchen DISCONTD: cyanocobalamin (,VITAMIN B-12,) 1000 MCG/ML injection INJECT 1CC EACH MONTH AS DIRECTED  10 mL  0  . DISCONTD: metoprolol tartrate (LOPRESSOR) 25 MG tablet Take 1 tablet (25 mg total) by mouth as directed. One in the am and 1/2 at night  135 tablet  3  . DISCONTD: nitroGLYCERIN (NITROSTAT) 0.4 MG SL tablet Place 0.4 mg under the tongue every 5 (five) minutes as needed.        Marland Kitchen azithromycin (ZITHROMAX) 250 MG tablet Take 2 tablets (500 mg) on  Day 1,  followed by 1 tablet (250 mg) once daily on Days 2 through 5.  6 each  0    Allergies  Allergen Reactions  . Lisinopril Cough  . Metformin     nausea    Patient Active Problem List  Diagnoses  . CAD (coronary artery disease)  . PAF (paroxysmal atrial fibrillation)  . HTN (hypertension)  . Chronic anticoagulation  . Dyspnea  . Hx of CABG  . S/P aortic  valve replacement with bioprosthetic valve  . Diarrhea  . Vitamin B12 deficiency  . Bronchitis    History  Smoking status  . Never Smoker   Smokeless tobacco  . Never Used    History  Alcohol Use No    Family History  Problem Relation Age of Onset  . Stroke Mother   . Stroke Father   . Heart disease Brother   . Heart disease Brother     Review of Systems: Constitutional: no fever chills diaphoresis or fatigue or change in weight.  Head and neck: no hearing loss, no epistaxis, no photophobia or visual disturbance. Respiratory: No cough, shortness of breath or wheezing. Cardiovascular: No chest pain peripheral edema, palpitations. Gastrointestinal: No abdominal distention, no abdominal pain, no change in bowel habits hematochezia or melena. Genitourinary: No dysuria, no frequency, no urgency, no nocturia. Musculoskeletal:No arthralgias, no back pain, no gait disturbance or myalgias. Neurological: No dizziness, no headaches, no numbness, no  seizures, no syncope, no weakness, no tremors. Hematologic: No lymphadenopathy, no easy bruising. Psychiatric: No confusion, no hallucinations, no sleep disturbance.    Physical Exam: Filed Vitals:   10/11/11 1026  BP: 130/76  Pulse: 48   the general appearance reveals a well-developed well-nourished woman in no distress.Pupils equal and reactive.   Extraocular Movements are full.  There is no scleral icterus.  The mouth and pharynx are normal.  The neck is supple.  The carotids reveal no bruits.  The jugular venous pressure is normal.  The thyroid is not enlarged.  There is no lymphadenopathy.  Chest reveals a few scattered rhonchi. The heart reveals a soft systolic ejection murmur at the base.The abdomen is soft and nontender. Bowel sounds are normal. The liver and spleen are not enlarged. There Are no abdominal masses. There are no bruits.  The pedal pulses are good.  There is no phlebitis or edema.  There is no cyanosis or clubbing. Strength is normal and symmetrical in all extremities.  There is no lateralizing weakness.  There are no sensory deficits.  The skin is warm and dry.  There is no rash.  EKG today shows marked sinus bradycardia at 48 per minute   Assessment / Plan: Continue same amiodarone.  Decrease metoprolol to 12.5 mg twice a day because of bradycardia.  He check in 4 months for followup office visit EKG CBC TSH and basal metabolic panel and hepatic function panel.  Blood work today is pending

## 2011-10-11 NOTE — Assessment & Plan Note (Signed)
When we last saw her in the office on 08/23/11 she was in atrial fibrillation.  She returns now and is back in normal sinus rhythm on amiodarone.  She is not having any new cardiac symptoms.  He has had recent symptoms of upper respiratory illness.

## 2011-10-11 NOTE — Patient Instructions (Addendum)
Will obtain labs today and call you with the results (cbc/bmet/hfp/tsh)  Will send new Rx for Nitrostat, vitamin b 12, and Zpak to your pharmacy.  Also need to get some OTC Mucinex 600 mg twice daily as needed.    Decrease Metoprolol 25 mg to 1/2 twice day  Your physician recommends that you schedule a follow-up appointment in: 4 months with fasting labs (cbc/bmet/hfp/tsh/lp) and EKG

## 2011-10-12 NOTE — Progress Notes (Signed)
Quick Note:  Please report to patient. The recent labs are stable. Continue same medication and careful diet. Thyroid level good. BS high--watch diet. ______

## 2011-10-13 ENCOUNTER — Telehealth: Payer: Self-pay | Admitting: *Deleted

## 2011-10-13 NOTE — Telephone Encounter (Signed)
Message copied by Burnell Blanks on Wed Oct 13, 2011 11:15 AM ------      Message from: Cassell Clement      Created: Tue Oct 12, 2011  8:33 PM       Please report to patient.  The recent labs are stable. Continue same medication and careful diet. Thyroid level good. BS high--watch diet.

## 2011-10-13 NOTE — Telephone Encounter (Signed)
Advised of labs 

## 2011-10-18 ENCOUNTER — Other Ambulatory Visit: Payer: Self-pay | Admitting: *Deleted

## 2011-10-22 ENCOUNTER — Telehealth: Payer: Self-pay | Admitting: Cardiology

## 2011-10-22 DIAGNOSIS — I4891 Unspecified atrial fibrillation: Secondary | ICD-10-CM

## 2011-10-22 MED ORDER — RIVAROXABAN 20 MG PO TABS: 20.0000 mg | ORAL_TABLET | Freq: Every day | ORAL | Status: DC

## 2011-10-22 NOTE — Telephone Encounter (Signed)
Please return call to patient at [phone]  Patient has been taking Pradaxa over the last couple of months, but this med is giving patient heart burn really bad.  Please return call to patient to advise if this medication can be changed.  She can be reached at hm# [phone].

## 2011-10-22 NOTE — Telephone Encounter (Signed)
Stop pradaxa and switch to Xarelto 20 mg one daily

## 2011-10-22 NOTE — Telephone Encounter (Signed)
Denies black stools or stomach discomfort.  Feels like esophagus is raw.  Taking generic Pepcid and this does not seem to be helping. Has happened with Pradaxa before.  Will forward to  Dr. Patty Sermons for review

## 2011-10-22 NOTE — Telephone Encounter (Signed)
Advised patient and will discontinue Pradaxa and start Xarelto

## 2011-11-18 ENCOUNTER — Other Ambulatory Visit: Payer: Self-pay | Admitting: Cardiology

## 2011-11-24 ENCOUNTER — Other Ambulatory Visit: Payer: Self-pay | Admitting: Cardiology

## 2012-01-24 ENCOUNTER — Encounter: Payer: Self-pay | Admitting: Cardiology

## 2012-01-24 ENCOUNTER — Ambulatory Visit (INDEPENDENT_AMBULATORY_CARE_PROVIDER_SITE_OTHER): Payer: Medicare Other | Admitting: Cardiology

## 2012-01-24 ENCOUNTER — Other Ambulatory Visit (INDEPENDENT_AMBULATORY_CARE_PROVIDER_SITE_OTHER): Payer: Medicare Other

## 2012-01-24 ENCOUNTER — Ambulatory Visit (INDEPENDENT_AMBULATORY_CARE_PROVIDER_SITE_OTHER)
Admission: RE | Admit: 2012-01-24 | Discharge: 2012-01-24 | Disposition: A | Discharge status: Home / Self Care | Payer: Medicare Other | Source: Ambulatory Visit | Attending: Cardiology | Admitting: Cardiology

## 2012-01-24 VITALS — BP 128/78 | HR 44 | Ht 65.0 in | Wt 216.0 lb

## 2012-01-24 DIAGNOSIS — I4891 Unspecified atrial fibrillation: Secondary | ICD-10-CM

## 2012-01-24 DIAGNOSIS — R0989 Other specified symptoms and signs involving the circulatory and respiratory systems: Secondary | ICD-10-CM

## 2012-01-24 DIAGNOSIS — I119 Hypertensive heart disease without heart failure: Secondary | ICD-10-CM

## 2012-01-24 DIAGNOSIS — I2510 Atherosclerotic heart disease of native coronary artery without angina pectoris: Secondary | ICD-10-CM

## 2012-01-24 DIAGNOSIS — R0600 Dyspnea, unspecified: Secondary | ICD-10-CM

## 2012-01-24 DIAGNOSIS — Z7901 Long term (current) use of anticoagulants: Secondary | ICD-10-CM

## 2012-01-24 DIAGNOSIS — E7800 Pure hypercholesterolemia, unspecified: Secondary | ICD-10-CM

## 2012-01-24 DIAGNOSIS — E538 Deficiency of other specified B group vitamins: Secondary | ICD-10-CM

## 2012-01-24 DIAGNOSIS — I480 Paroxysmal atrial fibrillation: Secondary | ICD-10-CM

## 2012-01-24 LAB — CBC WITH DIFFERENTIAL/PLATELET
Basophils Absolute: 0 10*3/uL (ref 0.0–0.1)
Basophils Relative: 0.5 % (ref 0.0–3.0)
Eosinophils Absolute: 0.4 10*3/uL (ref 0.0–0.7)
Hemoglobin: 12.8 g/dL (ref 12.0–15.0)
Lymphocytes Relative: 26.8 % (ref 12.0–46.0)
Lymphs Abs: 2 10*3/uL (ref 0.7–4.0)
MCHC: 32.4 g/dL (ref 30.0–36.0)
MCV: 90.1 fl (ref 78.0–100.0)
Monocytes Absolute: 0.3 10*3/uL (ref 0.1–1.0)
Neutro Abs: 4.8 10*3/uL (ref 1.4–7.7)
RDW: 15 % — ABNORMAL HIGH (ref 11.5–14.6)

## 2012-01-24 LAB — HEPATIC FUNCTION PANEL
AST: 20 U/L (ref 0–37)
Albumin: 3.6 g/dL (ref 3.5–5.2)
Alkaline Phosphatase: 91 U/L (ref 39–117)
Total Protein: 7.2 g/dL (ref 6.0–8.3)

## 2012-01-24 LAB — LIPID PANEL
LDL Cholesterol: 74 mg/dL (ref 0–99)
Total CHOL/HDL Ratio: 2
Triglycerides: 102 mg/dL (ref 0.0–149.0)

## 2012-01-24 LAB — BASIC METABOLIC PANEL
CO2: 22 mEq/L (ref 19–32)
Calcium: 8.8 mg/dL (ref 8.4–10.5)
Chloride: 106 mEq/L (ref 96–112)
Glucose, Bld: 106 mg/dL — ABNORMAL HIGH (ref 70–99)
Sodium: 140 mEq/L (ref 135–145)

## 2012-01-24 NOTE — Progress Notes (Signed)
Amber [name] Date of Birth:  [birth year] [facility] [address] [place], [place]  [address] [phone]         Fax   [phone]  History of Present Illness: This pleasant 76 year old woman is seen for a four-month followup office visit.  She has not been feeling too well.  She has a past history of ischemic heart disease and a past history of aortic stenosis.  She underwent CABG and aortic valve replacement with a fibrous aortic valve on 01/10/09.  She also has a past history of paroxysmal atrial fibrillation and is maintaining normal sinus rhythm on amiodarone 200 mg twice a day.  She is on Xarelto 20 mg daily for anticoagulation.  Current Outpatient Prescriptions  Medication Sig Dispense Refill  . amiodarone (PACERONE) 200 MG tablet Take 200 mg by mouth daily.      Marland Kitchen aspirin 81 MG tablet Take 81 mg by mouth daily.        Marland Kitchen atorvastatin (LIPITOR) 40 MG tablet TAKE 1 TABLET BY MOUTH EVERY DAY  30 tablet  6  . Calcium Carbonate-Vit D-Min (CALCIUM 1200 PO) Take by mouth daily.        . Cholecalciferol (VITAMIN D) 2000 UNITS CAPS Take by mouth.        . cyanocobalamin (,VITAMIN B-12,) 1000 MCG/ML injection Inject 1 mL (1,000 mcg total) into the muscle every 30 (thirty) days.  30 mL  1  . docusate sodium (COLACE) 100 MG capsule Take 100 mg by mouth daily. As needed  10 capsule  0  . famotidine (PEPCID) 20 MG tablet TAKE 1 TABLET BY MOUTH TWICE A DAY  60 tablet  12  . furosemide (LASIX) 20 MG tablet TAKE 2 TABLETS BY MOUTH EVERY DAY  60 tablet  5  . metoprolol tartrate (LOPRESSOR) 25 MG tablet Take 25 mg by mouth. 1/2 tablet daily      . nitroGLYCERIN (NITROSTAT) 0.4 MG SL tablet Place 1 tablet (0.4 mg total) under the tongue every 5 (five) minutes as needed.  25 tablet  prn  . raloxifene (EVISTA) 60 MG tablet Take 60 mg by mouth daily.        . Rivaroxaban (XARELTO) 20 MG TABS Take 20 mg by mouth daily.  30 tablet  5  . DISCONTD: amiodarone (PACERONE) 200 MG tablet  Take 1 tablet (200 mg total) by mouth 2 (two) times daily.  60 tablet  12  . ALPRAZolam (XANAX) 0.25 MG tablet as needed.      . fluticasone (FLONASE) 50 MCG/ACT nasal spray as directed.      Marland Kitchen losartan (COZAAR) 100 MG tablet Take 1 tablet (100 mg total) by mouth daily.  30 tablet  12  . meclizine (ANTIVERT) 12.5 MG tablet as needed.        Allergies  Allergen Reactions  . Lisinopril Cough  . Metformin     nausea    Patient Active Problem List  Diagnosis  . CAD (coronary artery disease)  . PAF (paroxysmal atrial fibrillation)  . HTN (hypertension)  . Chronic anticoagulation  . Dyspnea  . Hx of CABG  . S/P aortic valve replacement with bioprosthetic valve  . Diarrhea  . Vitamin B12 deficiency  . Bronchitis    History  Smoking status  . Never Smoker   Smokeless tobacco  . Never Used    History  Alcohol Use No    Family History  Problem Relation Age of Onset  . Stroke Mother   .  Stroke Father   . Heart disease Brother   . Heart disease Brother     Review of Systems: Constitutional: no fever chills diaphoresis or fatigue or change in weight.  Head and neck: no hearing loss, no epistaxis, no photophobia or visual disturbance. Respiratory: No cough, shortness of breath or wheezing. Cardiovascular: No chest pain peripheral edema, palpitations. Gastrointestinal: No abdominal distention, no abdominal pain, no change in bowel habits hematochezia or melena. Genitourinary: No dysuria, no frequency, no urgency, no nocturia. Musculoskeletal:No arthralgias, no back pain, no gait disturbance or myalgias. Neurological: No dizziness, no headaches, no numbness, no seizures, no syncope, no weakness, no tremors. Hematologic: No lymphadenopathy, no easy bruising. Psychiatric: No confusion, no hallucinations, no sleep disturbance.    Physical Exam: Filed Vitals:   01/24/12 0907  BP: 128/78  Pulse: 44   the general appearance reveals a well-developed well-nourished woman in  no acute distress.Pupils equal and reactive.   Extraocular Movements are full.  There is no scleral icterus.  The mouth and pharynx are normal.  The neck is supple.  The carotids reveal no bruits.  The jugular venous pressure is normal.  The thyroid is not enlarged.  There is no lymphadenopathy.The chest is clear to percussion and auscultation. There are no rales or rhonchi. Expansion of the chest is symmetrical.  The precordium is quiet.  The first heart sound is normal.  The second heart sound is physiologically split.  There is no murmur gallop rub or click.  There is no abnormal lift or heave.  The abdomen is soft and nontender. Bowel sounds are normal. The liver and spleen are not enlarged. There Are no abdominal masses. There are no bruits.  The pedal pulses are good.  There is no phlebitis or edema.  There is no cyanosis or clubbing. Strength is normal and symmetrical in all extremities.  There is no lateralizing weakness.  There are no sensory deficits.  The skin is warm and dry.  There is no rash.   EKG shows marked sinus bradycardia and no ischemic changes.     Assessment / Plan: We are decreasing amiodarone to 200 mg daily We are decreasing her metoprolol to 12.5 mg daily We are checking lab work today.  We will also get a chest x-ray and update her 2-D echo. Recheck in 4 months for followup office visit and EKG

## 2012-01-24 NOTE — Patient Instructions (Addendum)
Decrease your Metoprolol to 12.5 mg daily  Decrease your Amiodarone to 200 mg daily  Will have you go for a chest xray and call with the results  Your physician has requested that you have an echocardiogram. Echocardiography is a painless test that uses sound waves to create images of your heart. It provides your doctor with information about the size and shape of your heart and how well your heart's chambers and valves are working. This procedure takes approximately one hour. There are no restrictions for this procedure.  Your physician recommends that you schedule a follow-up appointment in: 4 months ov/ekg  Will obtain labs today and call you with the results

## 2012-01-24 NOTE — Assessment & Plan Note (Signed)
Since last visit the patient has been experiencing exertional dyspnea and weakness.  She sleeps on 3 pillows but denies any paroxysmal nocturnal dyspnea.  She has not been expressing any peripheral edema.  Her last chest x-ray was a year ago and we will update her chest x-ray.  We will also update her two-dimensional echocardiogram.

## 2012-01-24 NOTE — Assessment & Plan Note (Signed)
The patient has not been experiencing any recurrent angina pectoris. 

## 2012-01-24 NOTE — Assessment & Plan Note (Signed)
The patient has not been aware of any hematochezia or melena. 

## 2012-01-24 NOTE — Assessment & Plan Note (Signed)
The patient has not been aware of any recurrent atrial fibrillation.  EKG today shows marked sinus bradycardia reflecting the effects of amiodarone and beta blocker.   her pulse is 44 and regular.  The marked bradycardia may be contributing to her sensation of weakness.  We are also checking labs today including TSH and CBC

## 2012-01-25 ENCOUNTER — Telehealth: Payer: Self-pay | Admitting: *Deleted

## 2012-01-25 NOTE — Telephone Encounter (Signed)
Message copied by Burnell Blanks on Tue Jan 25, 2012  8:56 AM ------      Message from: Cassell Clement      Created: Mon Jan 24, 2012  9:16 PM       Chest xray shows no CHF.

## 2012-01-25 NOTE — Telephone Encounter (Signed)
Message copied by Burnell Blanks on Tue Jan 25, 2012  8:57 AM ------      Message from: Cassell Clement      Created: Mon Jan 24, 2012  9:18 PM       Please report.  Liver okay. Kidneys are too dry so decrease lasix to one tab daily. CBC and thyroid okay. Lipids good. Send copy to Dr. Nicholos Johns

## 2012-01-31 ENCOUNTER — Ambulatory Visit (HOSPITAL_COMMUNITY): Payer: Medicare Other | Attending: Cardiology | Admitting: Radiology

## 2012-01-31 ENCOUNTER — Other Ambulatory Visit: Payer: Self-pay

## 2012-01-31 DIAGNOSIS — I079 Rheumatic tricuspid valve disease, unspecified: Secondary | ICD-10-CM | POA: Insufficient documentation

## 2012-01-31 DIAGNOSIS — I4891 Unspecified atrial fibrillation: Secondary | ICD-10-CM

## 2012-01-31 DIAGNOSIS — I2510 Atherosclerotic heart disease of native coronary artery without angina pectoris: Secondary | ICD-10-CM | POA: Insufficient documentation

## 2012-01-31 DIAGNOSIS — I080 Rheumatic disorders of both mitral and aortic valves: Secondary | ICD-10-CM | POA: Insufficient documentation

## 2012-01-31 DIAGNOSIS — I359 Nonrheumatic aortic valve disorder, unspecified: Secondary | ICD-10-CM

## 2012-01-31 DIAGNOSIS — E785 Hyperlipidemia, unspecified: Secondary | ICD-10-CM | POA: Insufficient documentation

## 2012-01-31 DIAGNOSIS — I379 Nonrheumatic pulmonary valve disorder, unspecified: Secondary | ICD-10-CM | POA: Insufficient documentation

## 2012-01-31 NOTE — Progress Notes (Signed)
Echocardiogram performed.  

## 2012-02-03 ENCOUNTER — Telehealth: Payer: Self-pay | Admitting: Cardiology

## 2012-02-03 NOTE — Telephone Encounter (Signed)
Pt calling re echo results. °

## 2012-02-03 NOTE — Telephone Encounter (Signed)
Message copied by Burnell Blanks on Thu Feb 03, 2012  4:04 PM ------      Message from: Cassell Clement      Created: Tue Feb 01, 2012  9:08 PM       Please report.  2D echo shows good systolic function and the aortic valve prosthesis is functioning satisfactorily.  Continue present meds

## 2012-02-03 NOTE — Telephone Encounter (Signed)
Advised of echo  

## 2012-04-27 ENCOUNTER — Other Ambulatory Visit: Payer: Self-pay | Admitting: Cardiology

## 2012-04-27 DIAGNOSIS — I4891 Unspecified atrial fibrillation: Secondary | ICD-10-CM

## 2012-04-27 MED ORDER — RIVAROXABAN 20 MG PO TABS: 20.0000 mg | ORAL_TABLET | Freq: Every day | ORAL | Status: DC

## 2012-05-05 ENCOUNTER — Telehealth: Payer: Self-pay | Admitting: Cardiology

## 2012-05-05 NOTE — Telephone Encounter (Signed)
Pt was given rx for fluconazole anitfungal med, told to call dr Patty Sermons that it may cause an interaction with amiodarone  pls call [phone]

## 2012-05-05 NOTE — Telephone Encounter (Signed)
Will forward to  Dr. Brackbill for review 

## 2012-05-07 NOTE — Telephone Encounter (Signed)
Fluconazole can increase amiodarone levels.  Decrease amiodarone to just half tablet a day while taking fluconazole.

## 2012-05-08 NOTE — Telephone Encounter (Signed)
Advised patient

## 2012-05-19 ENCOUNTER — Emergency Department (HOSPITAL_COMMUNITY): Payer: Medicare Other

## 2012-05-19 ENCOUNTER — Encounter (HOSPITAL_COMMUNITY): Payer: Self-pay | Admitting: *Deleted

## 2012-05-19 ENCOUNTER — Inpatient Hospital Stay (HOSPITAL_COMMUNITY)
Admission: EM | Admit: 2012-05-19 | Discharge: 2012-05-20 | DRG: 193 | Disposition: A | Discharge status: Home / Self Care | Payer: Medicare Other | Attending: Internal Medicine | Admitting: Internal Medicine

## 2012-05-19 DIAGNOSIS — J40 Bronchitis, not specified as acute or chronic: Secondary | ICD-10-CM | POA: Diagnosis present

## 2012-05-19 DIAGNOSIS — I129 Hypertensive chronic kidney disease with stage 1 through stage 4 chronic kidney disease, or unspecified chronic kidney disease: Secondary | ICD-10-CM | POA: Diagnosis present

## 2012-05-19 DIAGNOSIS — I252 Old myocardial infarction: Secondary | ICD-10-CM

## 2012-05-19 DIAGNOSIS — E119 Type 2 diabetes mellitus without complications: Secondary | ICD-10-CM | POA: Diagnosis present

## 2012-05-19 DIAGNOSIS — I4891 Unspecified atrial fibrillation: Secondary | ICD-10-CM | POA: Diagnosis present

## 2012-05-19 DIAGNOSIS — M1990 Unspecified osteoarthritis, unspecified site: Secondary | ICD-10-CM | POA: Diagnosis present

## 2012-05-19 DIAGNOSIS — I5033 Acute on chronic diastolic (congestive) heart failure: Secondary | ICD-10-CM | POA: Diagnosis present

## 2012-05-19 DIAGNOSIS — Z22322 Carrier or suspected carrier of Methicillin resistant Staphylococcus aureus: Secondary | ICD-10-CM

## 2012-05-19 DIAGNOSIS — R05 Cough: Secondary | ICD-10-CM

## 2012-05-19 DIAGNOSIS — I498 Other specified cardiac arrhythmias: Secondary | ICD-10-CM | POA: Diagnosis present

## 2012-05-19 DIAGNOSIS — I509 Heart failure, unspecified: Secondary | ICD-10-CM | POA: Diagnosis present

## 2012-05-19 DIAGNOSIS — E785 Hyperlipidemia, unspecified: Secondary | ICD-10-CM | POA: Diagnosis present

## 2012-05-19 DIAGNOSIS — R042 Hemoptysis: Secondary | ICD-10-CM

## 2012-05-19 DIAGNOSIS — K219 Gastro-esophageal reflux disease without esophagitis: Secondary | ICD-10-CM | POA: Diagnosis present

## 2012-05-19 DIAGNOSIS — I779 Disorder of arteries and arterioles, unspecified: Secondary | ICD-10-CM | POA: Diagnosis present

## 2012-05-19 DIAGNOSIS — Z79899 Other long term (current) drug therapy: Secondary | ICD-10-CM

## 2012-05-19 DIAGNOSIS — Z952 Presence of prosthetic heart valve: Secondary | ICD-10-CM

## 2012-05-19 DIAGNOSIS — E538 Deficiency of other specified B group vitamins: Secondary | ICD-10-CM | POA: Diagnosis present

## 2012-05-19 DIAGNOSIS — I480 Paroxysmal atrial fibrillation: Secondary | ICD-10-CM

## 2012-05-19 DIAGNOSIS — R079 Chest pain, unspecified: Secondary | ICD-10-CM

## 2012-05-19 DIAGNOSIS — I2510 Atherosclerotic heart disease of native coronary artery without angina pectoris: Secondary | ICD-10-CM | POA: Diagnosis present

## 2012-05-19 DIAGNOSIS — Z953 Presence of xenogenic heart valve: Secondary | ICD-10-CM

## 2012-05-19 DIAGNOSIS — R001 Bradycardia, unspecified: Secondary | ICD-10-CM | POA: Diagnosis present

## 2012-05-19 DIAGNOSIS — R609 Edema, unspecified: Secondary | ICD-10-CM

## 2012-05-19 DIAGNOSIS — K449 Diaphragmatic hernia without obstruction or gangrene: Secondary | ICD-10-CM | POA: Diagnosis present

## 2012-05-19 DIAGNOSIS — Z90710 Acquired absence of both cervix and uterus: Secondary | ICD-10-CM

## 2012-05-19 DIAGNOSIS — N183 Chronic kidney disease, stage 3 unspecified: Secondary | ICD-10-CM | POA: Diagnosis present

## 2012-05-19 DIAGNOSIS — I2789 Other specified pulmonary heart diseases: Secondary | ICD-10-CM | POA: Diagnosis present

## 2012-05-19 DIAGNOSIS — Z7901 Long term (current) use of anticoagulants: Secondary | ICD-10-CM

## 2012-05-19 DIAGNOSIS — E876 Hypokalemia: Secondary | ICD-10-CM | POA: Diagnosis present

## 2012-05-19 DIAGNOSIS — Z888 Allergy status to other drugs, medicaments and biological substances status: Secondary | ICD-10-CM

## 2012-05-19 DIAGNOSIS — I10 Essential (primary) hypertension: Secondary | ICD-10-CM | POA: Diagnosis present

## 2012-05-19 DIAGNOSIS — D510 Vitamin B12 deficiency anemia due to intrinsic factor deficiency: Secondary | ICD-10-CM | POA: Diagnosis present

## 2012-05-19 DIAGNOSIS — Z9089 Acquired absence of other organs: Secondary | ICD-10-CM

## 2012-05-19 DIAGNOSIS — Z951 Presence of aortocoronary bypass graft: Secondary | ICD-10-CM

## 2012-05-19 DIAGNOSIS — Z7982 Long term (current) use of aspirin: Secondary | ICD-10-CM

## 2012-05-19 DIAGNOSIS — Z86711 Personal history of pulmonary embolism: Secondary | ICD-10-CM

## 2012-05-19 DIAGNOSIS — R911 Solitary pulmonary nodule: Secondary | ICD-10-CM | POA: Diagnosis present

## 2012-05-19 DIAGNOSIS — J189 Pneumonia, unspecified organism: Principal | ICD-10-CM | POA: Diagnosis present

## 2012-05-19 DIAGNOSIS — I359 Nonrheumatic aortic valve disorder, unspecified: Secondary | ICD-10-CM | POA: Diagnosis present

## 2012-05-19 DIAGNOSIS — D509 Iron deficiency anemia, unspecified: Secondary | ICD-10-CM | POA: Diagnosis present

## 2012-05-19 LAB — PRO B NATRIURETIC PEPTIDE: Pro B Natriuretic peptide (BNP): 3599 pg/mL — ABNORMAL HIGH (ref 0–125)

## 2012-05-19 LAB — CBC WITH DIFFERENTIAL/PLATELET
Basophils Absolute: 0 10*3/uL (ref 0.0–0.1)
Basophils Relative: 0 % (ref 0–1)
HCT: 36.2 % (ref 36.0–46.0)
Hemoglobin: 11.7 g/dL — ABNORMAL LOW (ref 12.0–15.0)
Lymphocytes Relative: 15 % (ref 12–46)
MCHC: 32.3 g/dL (ref 30.0–36.0)
Monocytes Absolute: 0.9 10*3/uL (ref 0.1–1.0)
Monocytes Relative: 8 % (ref 3–12)
Neutro Abs: 8 10*3/uL — ABNORMAL HIGH (ref 1.7–7.7)
Neutrophils Relative %: 75 % (ref 43–77)
RBC: 4.42 MIL/uL (ref 3.87–5.11)
WBC: 10.6 10*3/uL — ABNORMAL HIGH (ref 4.0–10.5)

## 2012-05-19 LAB — BASIC METABOLIC PANEL
BUN: 29 mg/dL — ABNORMAL HIGH (ref 6–23)
CO2: 22 mEq/L (ref 19–32)
Chloride: 102 mEq/L (ref 96–112)
GFR calc Af Amer: 29 mL/min — ABNORMAL LOW (ref >90)
Potassium: 3.2 mEq/L — ABNORMAL LOW (ref 3.5–5.1)

## 2012-05-19 LAB — PROTIME-INR
INR: 2.99 — ABNORMAL HIGH (ref 0.00–1.49)
Prothrombin Time: 29.5 seconds — ABNORMAL HIGH (ref 11.6–15.2)

## 2012-05-19 MED ORDER — ATORVASTATIN CALCIUM 40 MG PO TABS
40.0000 mg | ORAL_TABLET | Freq: Every day | ORAL | Status: DC
  Filled 2012-05-19: qty 1

## 2012-05-19 MED ORDER — RIVAROXABAN 20 MG PO TABS
20.0000 mg | ORAL_TABLET | Freq: Every day | ORAL | Status: DC
  Filled 2012-05-19: qty 1

## 2012-05-19 MED ORDER — RALOXIFENE HCL 60 MG PO TABS
60.0000 mg | ORAL_TABLET | Freq: Every day | ORAL | Status: DC
  Administered 2012-05-19 – 2012-05-20 (×2): 60 mg via ORAL
  Filled 2012-05-19 (×2): qty 1

## 2012-05-19 MED ORDER — FUROSEMIDE 10 MG/ML IJ SOLN
40.0000 mg | Freq: Once | INTRAMUSCULAR | Status: AC
  Administered 2012-05-19: 40 mg via INTRAVENOUS
  Filled 2012-05-19: qty 4

## 2012-05-19 MED ORDER — ASPIRIN 81 MG PO TABS: 81.0000 mg | ORAL_TABLET | Freq: Every day | ORAL | Status: DC

## 2012-05-19 MED ORDER — DOCUSATE SODIUM 100 MG PO CAPS
100.0000 mg | ORAL_CAPSULE | Freq: Every day | ORAL | Status: DC
  Administered 2012-05-19 – 2012-05-20 (×2): 100 mg via ORAL
  Filled 2012-05-19 (×2): qty 1

## 2012-05-19 MED ORDER — FAMOTIDINE 20 MG PO TABS
20.0000 mg | ORAL_TABLET | Freq: Two times a day (BID) | ORAL | Status: DC
  Administered 2012-05-19 – 2012-05-20 (×2): 20 mg via ORAL
  Filled 2012-05-19 (×3): qty 1

## 2012-05-19 MED ORDER — ONDANSETRON HCL 4 MG/2ML IJ SOLN: 4.0000 mg | Freq: Three times a day (TID) | INTRAMUSCULAR | Status: AC | PRN

## 2012-05-19 MED ORDER — SODIUM CHLORIDE 0.9 % IV BOLUS (SEPSIS)
500.0000 mL | Freq: Once | INTRAVENOUS | Status: AC
  Administered 2012-05-19: 500 mL via INTRAVENOUS

## 2012-05-19 MED ORDER — RIVAROXABAN 10 MG PO TABS
10.0000 mg | ORAL_TABLET | Freq: Every day | ORAL | Status: DC
  Filled 2012-05-19 (×2): qty 1

## 2012-05-19 MED ORDER — DEXTROSE 5 % IV SOLN
500.0000 mg | INTRAVENOUS | Status: DC
  Administered 2012-05-19 – 2012-05-20 (×2): 500 mg via INTRAVENOUS
  Filled 2012-05-19 (×3): qty 500

## 2012-05-19 MED ORDER — ACETAMINOPHEN 325 MG PO TABS: 650.0000 mg | ORAL_TABLET | Freq: Four times a day (QID) | ORAL | Status: DC | PRN

## 2012-05-19 MED ORDER — MECLIZINE HCL 12.5 MG PO TABS
12.5000 mg | ORAL_TABLET | Freq: Three times a day (TID) | ORAL | Status: DC | PRN
  Filled 2012-05-19: qty 1

## 2012-05-19 MED ORDER — DEXTROSE 5 % IV SOLN
1.0000 g | INTRAVENOUS | Status: DC
  Filled 2012-05-19: qty 10

## 2012-05-19 MED ORDER — ALPRAZOLAM 0.25 MG PO TABS: 0.2500 mg | ORAL_TABLET | Freq: Three times a day (TID) | ORAL | Status: DC | PRN

## 2012-05-19 MED ORDER — FLUTICASONE PROPIONATE 50 MCG/ACT NA SUSP
1.0000 | Freq: Every day | NASAL | Status: DC
  Administered 2012-05-19 – 2012-05-20 (×2): 1 via NASAL
  Filled 2012-05-19 (×2): qty 16

## 2012-05-19 MED ORDER — ACETAMINOPHEN 650 MG RE SUPP: 650.0000 mg | Freq: Four times a day (QID) | RECTAL | Status: DC | PRN

## 2012-05-19 MED ORDER — DEXTROSE 5 % IV SOLN
1.0000 g | Freq: Once | INTRAVENOUS | Status: AC
  Administered 2012-05-19: 1 g via INTRAVENOUS
  Filled 2012-05-19: qty 10

## 2012-05-19 MED ORDER — NITROGLYCERIN 0.4 MG SL SUBL: 0.4000 mg | SUBLINGUAL_TABLET | SUBLINGUAL | Status: DC | PRN

## 2012-05-19 MED ORDER — POTASSIUM CHLORIDE CRYS ER 20 MEQ PO TBCR
40.0000 meq | EXTENDED_RELEASE_TABLET | Freq: Once | ORAL | Status: AC
  Administered 2012-05-19: 40 meq via ORAL
  Filled 2012-05-19: qty 2

## 2012-05-19 MED ORDER — AMIODARONE HCL 200 MG PO TABS
200.0000 mg | ORAL_TABLET | Freq: Every day | ORAL | Status: DC
  Administered 2012-05-19 – 2012-05-20 (×2): 200 mg via ORAL
  Filled 2012-05-19 (×2): qty 1

## 2012-05-19 MED ORDER — ASPIRIN EC 81 MG PO TBEC
81.0000 mg | DELAYED_RELEASE_TABLET | Freq: Every day | ORAL | Status: DC
  Administered 2012-05-19 – 2012-05-20 (×2): 81 mg via ORAL
  Filled 2012-05-19 (×2): qty 1

## 2012-05-19 MED ORDER — LOSARTAN POTASSIUM 50 MG PO TABS
100.0000 mg | ORAL_TABLET | Freq: Every day | ORAL | Status: DC
Start: 2012-05-19 — End: 2012-05-20
  Administered 2012-05-19 – 2012-05-20 (×2): 100 mg via ORAL
  Filled 2012-05-19 (×2): qty 2

## 2012-05-19 MED ORDER — SODIUM CHLORIDE 0.9 % IJ SOLN
3.0000 mL | Freq: Two times a day (BID) | INTRAMUSCULAR | Status: DC
  Administered 2012-05-19 – 2012-05-20 (×2): 3 mL via INTRAVENOUS

## 2012-05-19 MED ORDER — HYDROCOD POLST-CHLORPHEN POLST 10-8 MG/5ML PO LQCR
5.0000 mL | Freq: Two times a day (BID) | ORAL | Status: DC
  Administered 2012-05-19 – 2012-05-20 (×2): 5 mL via ORAL
  Filled 2012-05-19 (×3): qty 5

## 2012-05-19 NOTE — Progress Notes (Signed)
VASCULAR LAB PRELIMINARY  PRELIMINARY  PRELIMINARY  PRELIMINARY  Bilateral lower extremity venous duplex completed    Preliminary report: Negative for dep and superficial vein thrombosis bilaterally.  Amber [name], RVT 05/19/2012, 4:15 PM   .

## 2012-05-19 NOTE — Progress Notes (Signed)
Pt arrived to room 4734 from ED.  Pt has been oriented to room and to unit.  Pt vitals and weight are being taken at this time.  Pt placed on telemetry and is currently Sinus Huston Foley.  Pt is alert and oriented, resting comfortably in bed, and does not appear in any acute distress.  Pt denies any chest pain, lightheadedness, or dizziness.  Admitting orders have been reviewed.  Pt denies any questions or concerns at this time.  Will report off to oncoming night shift RN. Nino Glow RN

## 2012-05-19 NOTE — ED Notes (Signed)
Pt transported to vascular.  °

## 2012-05-19 NOTE — Consult Note (Addendum)
PULMONARY  / CRITICAL CARE MEDICINE  Name: Amber [name] MRN: [id number] DOB: 1936/05/17    LOS: 0  REFERRING PROVIDER:  Dr. Lavera Guise  CHIEF COMPLAINT:  Hemoptysis  BRIEF PATIENT DESCRIPTION: [name] with PMH of CABG, AVR on xarelto with 3 week hx of URI and continued cough who presents with 3 episodes of quarter sized hemoptysis.    LINES / TUBES:   CULTURES:   ANTIBIOTICS: Rocephin 12/6>>> Azithro 12/6>>>  SIGNIFICANT EVENTS:  12/6 - Admit with hemoptysis on xarelto  LEVEL OF CARE:  Floor PRIMARY SERVICE:  TRH CONSULTANTS:  PCCM CODE STATUS:  Full DIET:  PO DVT Px:  Xarelto GI Px:  PPI  HISTORY OF PRESENT ILLNESS:  [name] with PMH of HTN, DM, HLD, CAD, CABG, AVR and AFIB on xarelto.  Noted history of PE 15 years ago.  Patient reports a 3 week hx of URI with nasal drainage, cough, chills.  Upper symptoms resolved however, she continued to have a dry cough.  12/6 early am she woke with a "coughing spell" and had 3 episodes of quarter sized hemoptysis mixed with some sputum. She reports she has recently been seen by Dr. Patty Sermons for dyspnea and LE swelling.  Over the past 6 months she has been less active - staying in bed and her chair only walking to the bathroom.  Notes weight gain but can not give a specific number.   She denies missing doses of xarelto.  Denies chest pain, n/v/d, fever.  ER evaluation demonstrates CT with two nodules with ? Hemorrhage.  PCCM asked to evaluate in the setting of hemoptysis & if she should come off xarelto.   PAST MEDICAL HISTORY :  Past Medical History  Diagnosis Date  . CAD (coronary artery disease) July 2010    s/p CABG  . S/P AVR (aortic valve replacement) July 2010  . 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 PAF (paroxysmal atrial fibrillation)   . Iron deficiency anemia   . HTN (hypertension)   . Diabetes mellitus type 2, diet-controlled   . Hyperlipidemia   . DJD (degenerative joint disease)   .  Carotid artery disease   . GERD (gastroesophageal reflux disease)   . Esophagitis     s/p esophageal dilatation  . Pernicious anemia     on B12 injections  . Chronic anticoagulation   . Shoulder fracture, left September 2011  . Fracture     of left humerus   . Nonsustained ventricular tachycardia   . DJD (degenerative joint disease)   . Shortness of breath   . Orthopnea      She reports 2-3-pillow orthopnea  . Lower extremity edema     bilateral   . Dizziness     developed intermittent episodes of dizziness  . Chest tightness or pressure   . Aortic stenosis   . Osteopenia   . Iron deficiency anemia   . Vitamin B12 deficiency   . Dyslipidemia   . Hiatal hernia   . History of pulmonary embolus (PE) 15 years ago   Past Surgical History  Procedure Date  . Coronary artery bypass graft 12/2008    CABG x 1; Dr. [name]  . Aortic valve replacement 12/2008  . Cardiac catheterization 02/2010  . Aspiration thrombectomy 07/2009  . Other surgical history     Total hysterectomy  . Cholecystectomy    Prior to Admission medications   Medication Sig Start Date End Date  Taking? Authorizing Provider  ALPRAZolam (XANAX) 0.25 MG tablet Take 0.25 mg by mouth 3 (three) times daily as needed. For anxiety 12/22/11  Yes Historical Provider, MD  amiodarone (PACERONE) 200 MG tablet Take 200 mg by mouth daily. 08/23/11 08/22/12 Yes Cassell Clement, MD  aspirin 81 MG tablet Take 81 mg by mouth daily.     Yes Historical Provider, MD  atorvastatin (LIPITOR) 40 MG tablet TAKE 1 TABLET BY MOUTH EVERY DAY 11/24/11  Yes Cassell Clement, MD  Calcium Carbonate-Vit D-Min (CALCIUM 1200 PO) Take by mouth daily.     Yes Historical Provider, MD  cyanocobalamin (,VITAMIN B-12,) 1000 MCG/ML injection Inject 1 mL (1,000 mcg total) into the muscle every 30 (thirty) days. 10/11/11  Yes Cassell Clement, MD  docusate sodium (COLACE) 100 MG capsule Take 100 mg by mouth daily. As needed 09/21/10  Yes Vesta Mixer, MD   famotidine (PEPCID) 20 MG tablet TAKE 1 TABLET BY MOUTH TWICE A DAY 04/14/11  Yes Cassell Clement, MD  fluticasone [facility]) 50 MCG/ACT nasal spray Place 1 spray into the nose daily.  12/29/11  Yes Historical Provider, MD  furosemide (LASIX) 20 MG tablet TAKE 2 TABLETS BY MOUTH EVERY DAY 11/18/11  Yes Cassell Clement, MD  losartan (COZAAR) 100 MG tablet Take 1 tablet (100 mg total) by mouth daily. 01/13/11 06/18/12 Yes Cassell Clement, MD  meclizine (ANTIVERT) 12.5 MG tablet Take 12.5 mg by mouth 3 (three) times daily as needed. For dizziness 12/29/11  Yes Historical Provider, MD  metoprolol tartrate (LOPRESSOR) 25 MG tablet Take 12.5 mg by mouth daily.  01/28/11  Yes Cassell Clement, MD  nitroGLYCERIN (NITROSTAT) 0.4 MG SL tablet Place 1 tablet (0.4 mg total) under the tongue every 5 (five) minutes as needed. 10/11/11  Yes Cassell Clement, MD  raloxifene (EVISTA) 60 MG tablet Take 60 mg by mouth daily.     Yes Historical Provider, MD  Rivaroxaban (XARELTO) 20 MG TABS Take 1 tablet (20 mg total) by mouth daily. 04/27/12  Yes Cassell Clement, MD   Allergies  Allergen Reactions  . Lisinopril Cough  . Metformin     nausea  . Fluconazole Rash    FAMILY HISTORY:  Family History  Problem Relation Age of Onset  . Stroke Mother   . Stroke Father   . Heart disease Brother   . Heart disease Brother    SOCIAL HISTORY:  reports that she has never smoked. She has never used smokeless tobacco. She reports that she does not drink alcohol or use illicit drugs.  REVIEW OF SYSTEMS:   All systems reviewed and are negative.  See HPI for positives.    INTERVAL HISTORY:  Lying on stretcher, no distress.   VITAL SIGNS: Temp:  [97.7 F (36.5 C)] 97.7 F (36.5 C) (12/06 0602) Pulse Rate:  [58-66] 58  (12/06 1315) Resp:  [16-18] 18  (12/06 1315) BP: (149-159)/(67-73) 159/67 mmHg (12/06 1315) SpO2:  [98 %-99 %] 98 % (12/06 1315)  PHYSICAL EXAMINATION: General:  wdwn adult female in NAD Neuro:  AAOx4,  speech clear, MAE HEENT:  Mm pink/moist, no jvd Cardiovascular:  s1s2 rrr, no m/r/g Lungs:  resp's even / non-labored, lungs bilaterally clear Abdomen:  Round/soft, bsx4 active Musculoskeletal:  No acute deformities Skin:  Warm/dry, no edema   Lab 05/19/12 0723  NA 138  K 3.2*  CL 102  CO2 22  BUN 29*  CREATININE 1.91*  GLUCOSE 126*    Lab 05/19/12 0624  HGB 11.7*  HCT 36.2  WBC 10.6*  PLT 343   Dg Chest 2 View  05/19/2012  *RADIOLOGY REPORT*  Clinical Data: hemoptysis starting this am with mid chest burning; SOB during activity; hx CAD, HTN, diabetic, CABG X3 years ago  CHEST - 2 VIEW  Comparison: 01/24/2012  Findings: Abnormal 2 cm density in the left mid lung.  Abnormal ill- defined abnormal densities in the left mid lung and left lung base on the frontal projection may represent nodules or early pneumonia. Mild scarring at the right lung base.  Prior median sternotomy and prior left proximal humeral prosthesis.  Chronically blunted left hemidiaphragm posteriorly.  Valve prosthesis noted.  IMPRESSION:  1.  Vague densities in the left lung are concerning for early pneumonia or pulmonary nodules. Entities such as atypical/fungal infection or malignancy not excluded.  I recommend either chest CT (with contrast if feasible), or careful conventional radiographic follow-up. 2.  Chronically blunted left hemidiaphragm. 3.  Chronic scarring at the right lung base.   Original Report Authenticated By: Gaylyn Rong, M.D.    Ct Chest Wo Contrast  05/19/2012  *RADIOLOGY REPORT*  Clinical Data: Gives 2 week history of bronchitic sx associated with fatigue and cough with yellow phlegm. Coughed up quarter size amount of bloody phlegm x3 at 0200 this am and none since. Densities on chest radiography.  CT CHEST WITHOUT CONTRAST  Technique:  Multidetector CT imaging of the chest was performed following the standard protocol without IV contrast.  Comparison: 05/19/2012 chest radiograph  Findings: The  patient did not receive IV contrast due to renal insufficiency.  Prior median sternotomy.  Coronary artery disease noted.  Small hiatal hernia.  No definite pathologic thoracic adenopathy.  The blood pool is less dense than the myocardium, suggesting anemia.  Left upper lobe pulmonary nodule with slightly spiculated margins measures 1.4 x 1.1 cm, image 21 of series 3.  Faint halo of ground- glass opacity along its edges noted.  The wedge-shaped airspace occupying process in the left lower lobe measures 3.4 x 2.5 cm on image 34 of series 3.  Linear subsegmental atelectasis noted in the left lower lobe. There is also subsegmental atelectasis or scarring in the right lower lobe  No adrenal mass observed.  IMPRESSION:  1.  1.4 cm right upper lobe pulmonary nodule with a low of adjacent ground-glass opacity suggesting perinodular hemorrhage.  In the immunocompromised/neutropenic patient, the appearance would raise special concern for angioinvasive fungal disease, most commonly Aspergillus. Serologic workup for Aspergillus/fungal disease may be warranted.  Tumor or other lesion with perilesional hemorrhage or infiltration can also cause this appearance. 2.  Wedge-shaped peripheral density in the left lower lobe without definite volume loss.  This also has a slight halo of hazy opacity along its margins.  Differential diagnostic considerations include pulmonary infarct with hemorrhage, fungal infection, or tumor. Given that the patient cannot receive IV contrast currently due to significant renal insufficiency, one might consider lower extremity Doppler venous ultrasound to assess for DVT, or nuclear medicine ventilation-perfusion lung scan to assess for other potential sites of pulmonary embolus as a concern.  If these lesions fail to respond to therapy, nuclear medicine PET CT or biopsy may be warranted. 3.  Hiatal hernia.   Original Report Authenticated By: Gaylyn Rong, M.D.     ASSESSMENT /  PLAN:  Hemoptysis Hemoptysis in the setting of xarelto for afib, avr and 3 weeks of URI with significant cough.  Minimal hemoptysis (3 episodes of quarter sized).  Hemodynamically stable.  URI / Cough is  likely source of bronchial irritation and resultant hemoptysis.  She has been faithful on xarelto.  Minimal suspicion for PE (and she is on anti-coagulation).  Areas on CT scan most likely hemoptysis but can not exclude pulmonary nodule.  Elevated BNP and deconditioning likely source of dyspnea.  She has no real pulmonary related risk factors (never smoker, worked as an Engineer, civil (consulting) to a Land).     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 -do not feel at this time the benefit of stopping anti-coagulation is warranted -follow up H/H -consider outpatient PT / Rehab efforts   Canary Brim, NP-C Pulmonary and Critical Care Medicine [facility] Pager: [phone]  05/19/2012, 2:24 PM  Patient seen and examined, agree with above note.  I dictated the care and orders written for this patient under my direction.  Koren Bound, M.D. [phone]

## 2012-05-19 NOTE — ED Provider Notes (Signed)
History     CSN: [id number]  Arrival date & time 05/19/12  0534   First MD Initiated Contact with Patient 05/19/12 [phone]      Chief Complaint  Patient presents with  . Hemoptysis    (Consider location/radiation/quality/duration/timing/severity/associated sxs/prior treatment) HPI Amber [name] is a pleasant 76 y.o. female history of coronary artery disease status post CABG in 2010 where she had mitral valve placement in 1 bypass. This is followed in 2012 with a myocardial infarction that happened concomitantly with a broken shoulder on the left. Patient has been on Coumadin therapy for paroxysmal atrial fibrillation and presents this morning with hemoptysis. Hemoptysis started at approximately 2 AM she says she filled her palm full bright red blood this is associated some burning in her mid chest and no new shortness of breath. Patient is short of breath at baseline. She does not have COPD and has never smoked. She has had a cough for 2-3 weeks and had some chills about a week ago. Addition she's had some nasal congestion and says she got "real hoarse" about a week ago-the day before Thanksgiving. She's also got some left shoulder blade pain on coughing.    Patient's cardiologist is Dr. Patty Sermons  Past Medical History  Diagnosis Date  . CAD (coronary artery disease) July 2010    s/p CABG  . S/P AVR (aortic valve replacement) July 2010  . 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 PAF (paroxysmal atrial fibrillation)   . Iron deficiency anemia   . HTN (hypertension)   . Diabetes mellitus type 2, diet-controlled   . Hyperlipidemia   . DJD (degenerative joint disease)   . Carotid artery disease   . GERD (gastroesophageal reflux disease)   . Esophagitis     s/p esophageal dilatation  . Pernicious anemia     on B12 injections  . Chronic anticoagulation   . Shoulder fracture, left September 2011  . Fracture     of left humerus   . Atrial fibrillation      Intermittent  . Nonsustained ventricular tachycardia   . DJD (degenerative joint disease)   . Shortness of breath   . Orthopnea      She reports 2-3-pillow orthopnea  . Lower extremity edema     bilateral   . Dizziness     developed intermittent episodes of dizziness  . Chest tightness or pressure   . Aortic stenosis   . Osteopenia   . Iron deficiency anemia   . Vitamin B12 deficiency   . Dyslipidemia   . Hiatal hernia   . History of pulmonary embolus (PE)     Past Surgical History  Procedure Date  . Coronary artery bypass graft 12/2008    CABG x 1; Dr. [name]  . Aortic valve replacement 12/2008  . Cardiac catheterization 02/2010  . Aspiration thrombectomy 07/2009  . Other surgical history     Total hysterectomy  . Cholecystectomy     Family History  Problem Relation Age of Onset  . Stroke Mother   . Stroke Father   . Heart disease Brother   . Heart disease Brother     History  Substance Use Topics  . Smoking status: Never Smoker   . Smokeless tobacco: Never Used  . Alcohol Use: No    OB History    Grav Para Term Preterm Abortions TAB SAB Ect Mult Living  Review of Systems At least 10pt or greater review of systems completed and are negative except where specified in the HPI.  Allergies  Lisinopril; Metformin; and Fluconazole  Home Medications   Current Outpatient Rx  Name  Route  Sig  Dispense  Refill  . ALPRAZOLAM 0.25 MG PO TABS   Oral   Take 0.25 mg by mouth 3 (three) times daily as needed. For anxiety         . AMIODARONE HCL 200 MG PO TABS   Oral   Take 200 mg by mouth daily.         . ASPIRIN 81 MG PO TABS   Oral   Take 81 mg by mouth daily.           . ATORVASTATIN CALCIUM 40 MG PO TABS      TAKE 1 TABLET BY MOUTH EVERY DAY   30 tablet   6   . CALCIUM 1200 PO   Oral   Take by mouth daily.           . CYANOCOBALAMIN 1000 MCG/ML IJ SOLN   Intramuscular   Inject 1 mL (1,000 mcg total) into the muscle every  30 (thirty) days.   30 mL   1   . DOCUSATE SODIUM 100 MG PO CAPS   Oral   Take 100 mg by mouth daily. As needed   10 capsule   0   . FAMOTIDINE 20 MG PO TABS      TAKE 1 TABLET BY MOUTH TWICE A DAY   60 tablet   12   . FLUTICASONE PROPIONATE 50 MCG/ACT NA SUSP   Nasal   Place 1 spray into the nose daily.          . FUROSEMIDE 20 MG PO TABS      TAKE 2 TABLETS BY MOUTH EVERY DAY   60 tablet   5     NEEDS REFILLS   . LOSARTAN POTASSIUM 100 MG PO TABS   Oral   Take 1 tablet (100 mg total) by mouth daily.   30 tablet   12   . MECLIZINE HCL 12.5 MG PO TABS   Oral   Take 12.5 mg by mouth 3 (three) times daily as needed. For dizziness         . METOPROLOL TARTRATE 25 MG PO TABS   Oral   Take 12.5 mg by mouth daily.          Marland Kitchen NITROGLYCERIN 0.4 MG SL SUBL   Sublingual   Place 1 tablet (0.4 mg total) under the tongue every 5 (five) minutes as needed.   25 tablet   prn   . RALOXIFENE HCL 60 MG PO TABS   Oral   Take 60 mg by mouth daily.           Marland Kitchen RIVAROXABAN 20 MG PO TABS   Oral   Take 1 tablet (20 mg total) by mouth daily.   30 tablet   5     90 day supply is acceptable     BP 149/73  Pulse 66  Temp 97.7 F (36.5 C) (Oral)  Resp 16  SpO2 99%  Physical Exam  Nursing notes reviewed.  Electronic medical record reviewed. VITAL SIGNS:   Filed Vitals:   05/19/12 0602  BP: 149/73  Pulse: 66  Temp: 97.7 F (36.5 C)  TempSrc: Oral  Resp: 16  SpO2: 99%   CONSTITUTIONAL: Awake, oriented, appears non-toxic HENT: Atraumatic, normocephalic, oral  mucosa pink and moist, airway patent. Nares patent without drainage. External ears normal. EYES: Conjunctiva clear, EOMI, PERRLA NECK: Trachea midline, non-tender, supple CARDIOVASCULAR: Normal heart rate, Normal rhythm, No murmurs, rubs, gallops PULMONARY/CHEST: Clear to auscultation, no rhonchi, wheezes, or rales. Mild decreased left base. Non-tender. ABDOMINAL: Non-distended, soft, non-tender - no  rebound or guarding.  BS normal. NEUROLOGIC: Non-focal, moving all four extremities, no gross sensory or motor deficits. EXTREMITIES: No clubbing, cyanosis, or edema SKIN: Warm, Dry, No erythema, No rash  ED Course  Procedures (including critical care time)  Labs Reviewed  PROTIME-INR - Abnormal; Notable for the following:    Prothrombin Time 29.5 (*)     INR 2.99 (*)     All other components within normal limits  CBC WITH DIFFERENTIAL - Abnormal; Notable for the following:    WBC 10.6 (*)     Hemoglobin 11.7 (*)     Neutro Abs 8.0 (*)     All other components within normal limits  BASIC METABOLIC PANEL  PRO B NATRIURETIC PEPTIDE  TROPONIN I  CULTURE, BLOOD (ROUTINE X 2)  CULTURE, BLOOD (ROUTINE X 2)   Dg Chest 2 View  05/19/2012  *RADIOLOGY REPORT*  Clinical Data: hemoptysis starting this am with mid chest burning; SOB during activity; hx CAD, HTN, diabetic, CABG X3 years ago  CHEST - 2 VIEW  Comparison: 01/24/2012  Findings: Abnormal 2 cm density in the left mid lung.  Abnormal ill- defined abnormal densities in the left mid lung and left lung base on the frontal projection may represent nodules or early pneumonia. Mild scarring at the right lung base.  Prior median sternotomy and prior left proximal humeral prosthesis.  Chronically blunted left hemidiaphragm posteriorly.  Valve prosthesis noted.  IMPRESSION:  1.  Vague densities in the left lung are concerning for early pneumonia or pulmonary nodules. Entities such as atypical/fungal infection or malignancy not excluded.  I recommend either chest CT (with contrast if feasible), or careful conventional radiographic follow-up. 2.  Chronically blunted left hemidiaphragm. 3.  Chronic scarring at the right lung base.   Original Report Authenticated By: Gaylyn Rong, M.D.      1. Hemoptysis   2. History of pulmonary embolus (PE)   3. Iron deficiency anemia   4. Bronchitis   5. Chronic anticoagulation   6. Cough   7. Pulmonary  nodule   8. Hx of CABG   9. Vitamin B12 deficiency   10. Chest pain   11. Atrial fibrillation   12. Chest pain   13. Acute on chronic diastolic CHF (congestive heart failure), NYHA class 2      MDM  Amber [name] is a 76 y.o. female history of mitral valve replacement on Coumadin therapy with an INR of 2.99 presents with hemoptysis. Patient's had 2-3 weeks of cough as well as chills, subjective fever last week.  Patient presentation of hemoptysis could be from either bronchitis, pneumonia, or Coumadin therapy-she's not supratherapeutic having had an MVR.  Patient does have some minor burning chest pain with cough.  Chest x-ray shows some vague densities in the left lung area concerning for early pneumonia or pulmonary nodules. Patient is diabetic and BMP was not obtained in triage-we'll obtain some further laboratory studies including basic metabolic panel, B. natruretic peptide, troponin - will assess renal function, give her 500 cc all is of normal saline right now and then she will need a CT of her chest with angiogram study to further characterize pulmonary nodules/density rule out  pulmonary infarction.  Differential diagnosis of hemoptysis includes, but is not limited to: bronchitis, bronchiectasis, bronchogenic carcinoma, pulmonary vasculature disease, left ventricular failure, mitral stenosis, pulmonary embolism, AV malformation, diseases of the pulmonary parenchyma including pneumonia, abscess, tuberculosis, Aspergilloma parasitic infection, Goodpasture's or Wegener's granulomatosis, SLE, crack cocaine inhalation, leukemia, anticoagulation or acute cough.  Patient's workup will be continued as an inpatient, discussed with Dr. Lavera Guise for admission. Discussed case with Dr. Patty Sermons also, we will not reverse Coumadin at this time - he will see the patient while in inpatient.     Jones Skene, MD 05/19/12 1732

## 2012-05-19 NOTE — ED Notes (Signed)
Gives 2 week history of bronchitic sx associated with fatigue and cough with yellow phlegm. Coughed up quarter size amount of bloody phlegm x3 at 0200 this am and none since. On Xarelto.

## 2012-05-19 NOTE — ED Notes (Signed)
Pt returned from vascular

## 2012-05-19 NOTE — H&P (Signed)
[facility] History and Physical  Amber [name] ZOX:[id number] DOB: Jan 15, 1936 DOA: 05/19/2012   PCP: Lolita Patella, MD  Specialists: Dr. Patty Sermons Cardiology   Chief Complaint:  Chief Complaint  Patient presents with  . Hemoptysis    HPI: Amber [name] is a 76 y.o. female with hx of cabg and AVR, who presented to the ED today c/o 3 episodes of hemoptysis through the night. Patient also reports that she has been having significant coughing with yellow maroon sputum production for the past 3 weeks. The patient reports chills but she doesn't know she had a fever. Patient takes Xarelto for afib. The patient's daughter reports that Amber [name] has had a difficult time with ambulation for the past months. She gets short of breath with walking for little distance.   Review of Systems:  Aspirin history of present illness, all other systems were reviewed and were negative  Past Medical History  Diagnosis Date  . CAD (coronary artery disease) July 2010    s/p CABG  . S/P AVR (aortic valve replacement) July 2010  . 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 PAF (paroxysmal atrial fibrillation)   . Iron deficiency anemia   . HTN (hypertension)   . Diabetes mellitus type 2, diet-controlled   . Hyperlipidemia   . DJD (degenerative joint disease)   . Carotid artery disease   . GERD (gastroesophageal reflux disease)   . Esophagitis     s/p esophageal dilatation  . Pernicious anemia     on B12 injections  . Chronic anticoagulation   . Shoulder fracture, left September 2011  . Fracture     of left humerus   . Atrial fibrillation     Intermittent  . Nonsustained ventricular tachycardia   . DJD (degenerative joint disease)   . Shortness of breath   . Orthopnea      She reports 2-3-pillow orthopnea  . Lower extremity edema     bilateral   . Dizziness     developed intermittent episodes of dizziness  . Chest tightness or pressure   . Aortic stenosis    . Osteopenia   . Iron deficiency anemia   . Vitamin B12 deficiency   . Dyslipidemia   . Hiatal hernia   . History of pulmonary embolus (PE) 15 years ago   Past Surgical History  Procedure Date  . Coronary artery bypass graft 12/2008    CABG x 1; Dr. [name]  . Aortic valve replacement 12/2008  . Cardiac catheterization 02/2010  . Aspiration thrombectomy 07/2009  . Other surgical history     Total hysterectomy  . Cholecystectomy    Social History:  reports that she has never smoked. She has never used smokeless tobacco. She reports that she does not drink alcohol or use illicit drugs. 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  Allergies  Allergen Reactions  . Lisinopril Cough  . Metformin     nausea  . Fluconazole Rash    Family History  Problem Relation Age of Onset  . Stroke Mother   . Stroke Father   . Heart disease Brother   . Heart disease Brother     Prior to Admission medications   Medication Sig Start Date End Date Taking? Authorizing Provider  ALPRAZolam (XANAX) 0.25 MG tablet Take 0.25 mg by mouth 3 (three) times daily as needed. For anxiety 12/22/11  Yes Historical Provider, MD  amiodarone (PACERONE)  200 MG tablet Take 200 mg by mouth daily. 08/23/11 08/22/12 Yes Cassell Clement, MD  aspirin 81 MG tablet Take 81 mg by mouth daily.     Yes Historical Provider, MD  atorvastatin (LIPITOR) 40 MG tablet TAKE 1 TABLET BY MOUTH EVERY DAY 11/24/11  Yes Cassell Clement, MD  Calcium Carbonate-Vit D-Min (CALCIUM 1200 PO) Take by mouth daily.     Yes Historical Provider, MD  cyanocobalamin (,VITAMIN B-12,) 1000 MCG/ML injection Inject 1 mL (1,000 mcg total) into the muscle every 30 (thirty) days. 10/11/11  Yes Cassell Clement, MD  docusate sodium (COLACE) 100 MG capsule Take 100 mg by mouth daily. As needed 09/21/10  Yes Vesta Mixer, MD  famotidine (PEPCID) 20 MG tablet TAKE 1 TABLET BY MOUTH TWICE A DAY 04/14/11  Yes Cassell Clement, MD   fluticasone [facility]) 50 MCG/ACT nasal spray Place 1 spray into the nose daily.  12/29/11  Yes Historical Provider, MD  furosemide (LASIX) 20 MG tablet TAKE 2 TABLETS BY MOUTH EVERY DAY 11/18/11  Yes Cassell Clement, MD  losartan (COZAAR) 100 MG tablet Take 1 tablet (100 mg total) by mouth daily. 01/13/11 06/18/12 Yes Cassell Clement, MD  meclizine (ANTIVERT) 12.5 MG tablet Take 12.5 mg by mouth 3 (three) times daily as needed. For dizziness 12/29/11  Yes Historical Provider, MD  metoprolol tartrate (LOPRESSOR) 25 MG tablet Take 12.5 mg by mouth daily.  01/28/11  Yes Cassell Clement, MD  nitroGLYCERIN (NITROSTAT) 0.4 MG SL tablet Place 1 tablet (0.4 mg total) under the tongue every 5 (five) minutes as needed. 10/11/11  Yes Cassell Clement, MD  raloxifene (EVISTA) 60 MG tablet Take 60 mg by mouth daily.     Yes Historical Provider, MD  Rivaroxaban (XARELTO) 20 MG TABS Take 1 tablet (20 mg total) by mouth daily. 04/27/12  Yes Cassell Clement, MD   Physical Exam: Filed Vitals:   05/19/12 0602 05/19/12 1315  BP: 149/73 159/67  Pulse: 66 58  Temp: 97.7 F (36.5 C)   TempSrc: Oral   Resp: 16 18  SpO2: 99% 98%     General:  Alert and oriented x3, in no acute respiratory distress  Eyes: Pupils equal and round react to light accommodation, extraocular movement is intact  ENT: Clear pharynx without erythema or exudates  Neck: No significant jugular venous distention beyond probably a couple of centimeters above the clavicle  Cardiovascular: Regular rate and rhythm, no significant murmurs rubs or gallops  Respiratory: Clear to auscultation bilaterally without wheezes rhonchi crackles  Abdomen: Soft, nontender nondistended bowel sounds are present  Skin: Warm dry without suspicious rashes  Musculoskeletal: No significant joint deformities  Psychiatric: Euthymic  Neurologic: Cranial nerves 2-12 intact, strength 5 out of 5 in all 4 extremities, sensation intact  Labs on Admission:  Basic  Metabolic Panel:  Lab 05/19/12 6578  NA 138  K 3.2*  CL 102  CO2 22  GLUCOSE 126*  BUN 29*  CREATININE 1.91*  CALCIUM 9.1  MG --  PHOS --   Liver Function Tests: No results found for this basename: AST:5,ALT:5,ALKPHOS:5,BILITOT:5,PROT:5,ALBUMIN:5 in the last 168 hours No results found for this basename: LIPASE:5,AMYLASE:5 in the last 168 hours No results found for this basename: AMMONIA:5 in the last 168 hours CBC:  Lab 05/19/12 0624  WBC 10.6*  NEUTROABS 8.0*  HGB 11.7*  HCT 36.2  MCV 81.9  PLT 343   Cardiac Enzymes:  Lab 05/19/12 0724  CKTOTAL --  CKMB --  CKMBINDEX --  TROPONINI <0.30  BNP (last 3 results)  Basename 05/19/12 0724  PROBNP 3599.0*   CBG: No results found for this basename: GLUCAP:5 in the last 168 hours  Radiological Exams on Admission: Dg Chest 2 View  05/19/2012  *RADIOLOGY REPORT*  Clinical Data: hemoptysis starting this am with mid chest burning; SOB during activity; hx CAD, HTN, diabetic, CABG X3 years ago  CHEST - 2 VIEW  Comparison: 01/24/2012  Findings: Abnormal 2 cm density in the left mid lung.  Abnormal ill- defined abnormal densities in the left mid lung and left lung base on the frontal projection may represent nodules or early pneumonia. Mild scarring at the right lung base.  Prior median sternotomy and prior left proximal humeral prosthesis.  Chronically blunted left hemidiaphragm posteriorly.  Valve prosthesis noted.  IMPRESSION:  1.  Vague densities in the left lung are concerning for early pneumonia or pulmonary nodules. Entities such as atypical/fungal infection or malignancy not excluded.  I recommend either chest CT (with contrast if feasible), or careful conventional radiographic follow-up. 2.  Chronically blunted left hemidiaphragm. 3.  Chronic scarring at the right lung base.   Original Report Authenticated By: Gaylyn Rong, M.D.    Ct Chest Wo Contrast  05/19/2012  *RADIOLOGY REPORT*  Clinical Data: Gives 2 week  history of bronchitic sx associated with fatigue and cough with yellow phlegm. Coughed up quarter size amount of bloody phlegm x3 at 0200 this am and none since. Densities on chest radiography.  CT CHEST WITHOUT CONTRAST  Technique:  Multidetector CT imaging of the chest was performed following the standard protocol without IV contrast.  Comparison: 05/19/2012 chest radiograph  Findings: The patient did not receive IV contrast due to renal insufficiency.  Prior median sternotomy.  Coronary artery disease noted.  Small hiatal hernia.  No definite pathologic thoracic adenopathy.  The blood pool is less dense than the myocardium, suggesting anemia.  Left upper lobe pulmonary nodule with slightly spiculated margins measures 1.4 x 1.1 cm, image 21 of series 3.  Faint halo of ground- glass opacity along its edges noted.  The wedge-shaped airspace occupying process in the left lower lobe measures 3.4 x 2.5 cm on image 34 of series 3.  Linear subsegmental atelectasis noted in the left lower lobe. There is also subsegmental atelectasis or scarring in the right lower lobe  No adrenal mass observed.  IMPRESSION:  1.  1.4 cm right upper lobe pulmonary nodule with a low of adjacent ground-glass opacity suggesting perinodular hemorrhage.  In the immunocompromised/neutropenic patient, the appearance would raise special concern for angioinvasive fungal disease, most commonly Aspergillus. Serologic workup for Aspergillus/fungal disease may be warranted.  Tumor or other lesion with perilesional hemorrhage or infiltration can also cause this appearance. 2.  Wedge-shaped peripheral density in the left lower lobe without definite volume loss.  This also has a slight halo of hazy opacity along its margins.  Differential diagnostic considerations include pulmonary infarct with hemorrhage, fungal infection, or tumor. Given that the patient cannot receive IV contrast currently due to significant renal insufficiency, one might consider lower  extremity Doppler venous ultrasound to assess for DVT, or nuclear medicine ventilation-perfusion lung scan to assess for other potential sites of pulmonary embolus as a concern.  If these lesions fail to respond to therapy, nuclear medicine PET CT or biopsy may be warranted. 3.  Hiatal hernia.   Original Report Authenticated By: Gaylyn Rong, M.D.       Assessment/Plan Principal Problem:  *Hemoptysis Active Problems:  CAD (coronary artery disease)  PAF (paroxysmal atrial fibrillation)  HTN (hypertension)  Chronic anticoagulation  Hx of CABG  S/P aortic valve replacement with bioprosthetic valve  Vitamin B12 deficiency  Bronchitis  Cough  CKD (chronic kidney disease) stage 3, GFR 30-59 ml/min   1. Hemoptysis - most likely cause is a infectious process either pneumonia or bronchitis. The patient has no risk factor for tuberculosis. Patient is not immunocompromised. No reason to believe this patient will have Aspergillus. I don't think she has significant cardiac disease to cause pulmonary edema to the point of alveolar hemorrhage. I would treat with antibiotics, cough suppressants and follow clinical course. We will use 10 mg Xarelto reduced dose due to reduced creatinine clearance. 2. Lung nodule-patient must followup with pulmonary in the clinic. Inpatient consults called to streamline the process 3. Acute on chronic diastolic congestive heart failure, status post aortic valve replacement - agree with cardiology consult and that the patient might benefit from IV Lasix.  4. Chronic anemia-follow hemoglobin closely 5. Chronic kidney disease stage III-follow creatinine clearance closely History of PE with hemoptysis and wedge shaped opacity on the chest CT - will obtain venous Dopplers to rule out thrombolic disease. It is worth noting that the patient has mild pulmonary hypertension on the last echocardiogram. She is already treated with anticoagulation.  Code Status: full  Family  Communication: daughter  Disposition Plan: home (2 nights anticipated LOS)  Time spent: 71  Latysha Thackston [facility] Pager [phone]  If 7PM-7AM, please contact night-coverage [email or web address] Password [email or web address] 05/19/2012, 1:50 PM

## 2012-05-19 NOTE — Consult Note (Signed)
Cardiology Consult Note   Patient ID: [name] MRN: [id number], DOB/AGE: 02/25/1936   Admit date: 05/19/2012 Date of Consult: 05/19/2012  Primary Physician: Lolita Patella, MD Primary Cardiologist: Cassell Clement, MD  Reason for consult: hemoptysis on anticoagulation  HPI: Ms. Amber [name] is a 76yo female with PMHx s/f CAD (s/p CABG x 1 in 2010), AS (s/p bioprosthetic AVR in 2010), PAF, diastolic dysfunction (see echo below), chronic anticoagulation (Xarelto), CKD (stage III), GERD, carotid artery disease, hx of PE, DM2, HTN and HL who presents to [facility] ED with hemoptysis.  Patient reports 2-3 weeks history of cough w/ assoc chills and subjective fever. No history of tobacco abuse or COPD. She reports coughing today around 2 AM and produced a quarter-sized amount of bright red blood with associated chest burning. She reports associated wheezing during this time. She has noticed increased shortness of breath and DOE. Denies orthopnea, PND, increased LE edema, exertional chest pain, syncope or palpitations. Has taken medications as prescribed. No sick contacts. No active bleeding elsewhere. No unilateral leg pain or redness. Patient had an aunt with lung CA, otherwise no other family history of pulmonary issues. No prior diagnosis of heart failure.   01/2012 echo: LVEF 55-60%, high ventricular filling pressures, mild-mod bioprosthetic AV stenosis, mild MR, mild LA dilatation, PASP 34 mmHg and trivial pericardial effusion.   In the ED, VSS. Afebrile. EKG pending. TnI WNL. pBNP 3599.0. CBC reveals a mild leukocytosis at 10.6. BMET reveals mild hypokalemia at 3.2, BUN/Cr elevated at 29/1.91 (baseline 1.4-1.6). INR 2.99. CXR revealed vague left lung densities prompting chest CT further defining a RUL pulmonary nodule with low adjacent ground-glass opacity suggesting perinodular hemorrhage and wedge-shaped peripheral LLL density. Former nodule concerning for Aspergillus, latter concerning  for pulmonary infarct with hemorrage, fungal infection or tumor.   Problem List: Past Medical History  Diagnosis Date  . CAD (coronary artery disease) July 2010    s/p CABG  . S/P AVR (aortic valve replacement) July 2010  . 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 PAF (paroxysmal atrial fibrillation)   . Iron deficiency anemia   . HTN (hypertension)   . Diabetes mellitus type 2, diet-controlled   . Hyperlipidemia   . DJD (degenerative joint disease)   . Carotid artery disease   . GERD (gastroesophageal reflux disease)   . Esophagitis     s/p esophageal dilatation  . Pernicious anemia     on B12 injections  . Chronic anticoagulation   . Shoulder fracture, left September 2011  . Fracture     of left humerus   . Atrial fibrillation     Intermittent  . Nonsustained ventricular tachycardia   . DJD (degenerative joint disease)   . Shortness of breath   . Orthopnea      She reports 2-3-pillow orthopnea  . Lower extremity edema     bilateral   . Dizziness     developed intermittent episodes of dizziness  . Chest tightness or pressure   . Aortic stenosis   . Osteopenia   . Iron deficiency anemia   . Vitamin B12 deficiency   . Dyslipidemia   . Hiatal hernia   . History of pulmonary embolus (PE) 15 years ago    Past Surgical History  Procedure Date  . Coronary artery bypass graft 12/2008    CABG x 1; Dr. [name]  . Aortic valve replacement 12/2008  . Cardiac catheterization 02/2010  . Aspiration thrombectomy 07/2009  .  Other surgical history     Total hysterectomy  . Cholecystectomy      Allergies:  Allergies  Allergen Reactions  . Lisinopril Cough  . Metformin     nausea  . Fluconazole Rash    Home Medications: Prior to Admission medications   Medication Sig Start Date End Date Taking? Authorizing Provider  ALPRAZolam (XANAX) 0.25 MG tablet Take 0.25 mg by mouth 3 (three) times daily as needed. For anxiety 12/22/11  Yes Historical  Provider, MD  amiodarone (PACERONE) 200 MG tablet Take 200 mg by mouth daily. 08/23/11 08/22/12 Yes Cassell Clement, MD  aspirin 81 MG tablet Take 81 mg by mouth daily.     Yes Historical Provider, MD  atorvastatin (LIPITOR) 40 MG tablet TAKE 1 TABLET BY MOUTH EVERY DAY 11/24/11  Yes Cassell Clement, MD  Calcium Carbonate-Vit D-Min (CALCIUM 1200 PO) Take by mouth daily.     Yes Historical Provider, MD  cyanocobalamin (,VITAMIN B-12,) 1000 MCG/ML injection Inject 1 mL (1,000 mcg total) into the muscle every 30 (thirty) days. 10/11/11  Yes Cassell Clement, MD  docusate sodium (COLACE) 100 MG capsule Take 100 mg by mouth daily. As needed 09/21/10  Yes Vesta Mixer, MD  famotidine (PEPCID) 20 MG tablet TAKE 1 TABLET BY MOUTH TWICE A DAY 04/14/11  Yes Cassell Clement, MD  fluticasone [facility]) 50 MCG/ACT nasal spray Place 1 spray into the nose daily.  12/29/11  Yes Historical Provider, MD  furosemide (LASIX) 20 MG tablet TAKE 2 TABLETS BY MOUTH EVERY DAY 11/18/11  Yes Cassell Clement, MD  losartan (COZAAR) 100 MG tablet Take 1 tablet (100 mg total) by mouth daily. 01/13/11 06/18/12 Yes Cassell Clement, MD  meclizine (ANTIVERT) 12.5 MG tablet Take 12.5 mg by mouth 3 (three) times daily as needed. For dizziness 12/29/11  Yes Historical Provider, MD  metoprolol tartrate (LOPRESSOR) 25 MG tablet Take 12.5 mg by mouth daily.  01/28/11  Yes Cassell Clement, MD  nitroGLYCERIN (NITROSTAT) 0.4 MG SL tablet Place 1 tablet (0.4 mg total) under the tongue every 5 (five) minutes as needed. 10/11/11  Yes Cassell Clement, MD  raloxifene (EVISTA) 60 MG tablet Take 60 mg by mouth daily.     Yes Historical Provider, MD  Rivaroxaban (XARELTO) 20 MG TABS Take 1 tablet (20 mg total) by mouth daily. 04/27/12  Yes Cassell Clement, MD    Inpatient Medications:     (Not in a hospital admission)  Family History  Problem Relation Age of Onset  . Stroke Mother   . Stroke Father   . Heart disease Brother   . Heart disease  Brother      History   Social History  . Marital Status: Widowed    Spouse Name: N/A    Number of Children: N/A  . Years of Education: N/A   Occupational History  . Retired Investment banker, operational    Social History Main Topics  . Smoking status: Never Smoker   . Smokeless tobacco: Never Used  . Alcohol Use: No  . Drug Use: No  . Sexually Active:    Other Topics Concern  . Not on file   Social History Narrative  . No narrative on file     Review of Systems: General: positive for chills, subjective fever, negative for night sweats or weight changes.  Cardiovascular: positive for chest pain, SOB, DOE, negative for edema, orthopnea, palpitations, paroxysmal nocturnal dyspnea Dermatological: negative for rash Respiratory: positive for cough, hemoptysis, wheezing, shortness of breath and DOE Urologic: negative  for hematuria Abdominal: negative for nausea, vomiting, diarrhea, bright red blood per rectum, melena, or hematemesis Neurologic: negative for visual changes, syncope, or dizziness All other systems reviewed and are otherwise negative except as noted above.  Physical Exam: Blood pressure 159/67, pulse 58, temperature 97.7 F (36.5 C), temperature source Oral, resp. rate 18, SpO2 98.00%.    General: Well developed, well nourished, in no acute distress. Head: Normocephalic, atraumatic, sclera non-icteric, no xanthomas, nares are without discharge.  Neck: Negative for carotid bruits. JVP 7 cm.  Lungs: Expiratory wheezing appreciated, trace posterior centralized rhonchi. No appreciable rales. Breathing is unlabored. Heart: RRR with S1, dynamically split S2. No murmurs, rubs, or gallops appreciated. Abdomen: Soft, non-tender, non-distended with normoactive bowel sounds. No hepatomegaly. No rebound/guarding. No obvious abdominal masses. Msk:  Strength and tone appears normal for age. Extremities: Trace pedal edema bilaterally, no clubbing or cyanosis.  Distal pedal pulses are 2+  and equal bilaterally. Neuro: Alert and oriented X 3. Moves all extremities spontaneously. Psych:  Responds to questions appropriately with a normal affect.  Labs: Recent Labs  [facility] 05/19/12 0624   WBC 10.6*   HGB 11.7*   HCT 36.2   MCV 81.9   PLT 343   Lab 05/19/12 0723  NA 138  K 3.2*  CL 102  CO2 22  BUN 29*  CREATININE 1.91*  CALCIUM 9.1  PROT --  BILITOT --  ALKPHOS --  ALT --  AST --  AMYLASE --  LIPASE --  GLUCOSE 126*   Recent Labs  Basename 05/19/12 0724   CKTOTAL --   CKMB --   CKMBINDEX --   TROPONINI <0.30   Radiology/Studies: Dg Chest 2 View  05/19/2012  *RADIOLOGY REPORT*  Clinical Data: hemoptysis starting this am with mid chest burning; SOB during activity; hx CAD, HTN, diabetic, CABG X3 years ago  CHEST - 2 VIEW  Comparison: 01/24/2012  Findings: Abnormal 2 cm density in the left mid lung.  Abnormal ill- defined abnormal densities in the left mid lung and left lung base on the frontal projection may represent nodules or early pneumonia. Mild scarring at the right lung base.  Prior median sternotomy and prior left proximal humeral prosthesis.  Chronically blunted left hemidiaphragm posteriorly.  Valve prosthesis noted.  IMPRESSION:  1.  Vague densities in the left lung are concerning for early pneumonia or pulmonary nodules. Entities such as atypical/fungal infection or malignancy not excluded.  I recommend either chest CT (with contrast if feasible), or careful conventional radiographic follow-up. 2.  Chronically blunted left hemidiaphragm. 3.  Chronic scarring at the right lung base.   Original Report Authenticated By: Gaylyn Rong, M.D.    Ct Chest Wo Contrast  05/19/2012  *RADIOLOGY REPORT*  Clinical Data: Gives 2 week history of bronchitic sx associated with fatigue and cough with yellow phlegm. Coughed up quarter size amount of bloody phlegm x3 at 0200 this am and none since. Densities on chest radiography.  CT CHEST WITHOUT CONTRAST  Technique:   Multidetector CT imaging of the chest was performed following the standard protocol without IV contrast.  Comparison: 05/19/2012 chest radiograph  Findings: The patient did not receive IV contrast due to renal insufficiency.  Prior median sternotomy.  Coronary artery disease noted.  Small hiatal hernia.  No definite pathologic thoracic adenopathy.  The blood pool is less dense than the myocardium, suggesting anemia.  Left upper lobe pulmonary nodule with slightly spiculated margins measures 1.4 x 1.1 cm, image 21 of series 3.  Faint  halo of ground- glass opacity along its edges noted.  The wedge-shaped airspace occupying process in the left lower lobe measures 3.4 x 2.5 cm on image 34 of series 3.  Linear subsegmental atelectasis noted in the left lower lobe. There is also subsegmental atelectasis or scarring in the right lower lobe  No adrenal mass observed.  IMPRESSION:  1.  1.4 cm right upper lobe pulmonary nodule with a low of adjacent ground-glass opacity suggesting perinodular hemorrhage.  In the immunocompromised/neutropenic patient, the appearance would raise special concern for angioinvasive fungal disease, most commonly Aspergillus. Serologic workup for Aspergillus/fungal disease may be warranted.  Tumor or other lesion with perilesional hemorrhage or infiltration can also cause this appearance. 2.  Wedge-shaped peripheral density in the left lower lobe without definite volume loss.  This also has a slight halo of hazy opacity along its margins.  Differential diagnostic considerations include pulmonary infarct with hemorrhage, fungal infection, or tumor. Given that the patient cannot receive IV contrast currently due to significant renal insufficiency, one might consider lower extremity Doppler venous ultrasound to assess for DVT, or nuclear medicine ventilation-perfusion lung scan to assess for other potential sites of pulmonary embolus as a concern.  If these lesions fail to respond to therapy, nuclear  medicine PET CT or biopsy may be warranted. 3.  Hiatal hernia.   Original Report Authenticated By: Gaylyn Rong, M.D.    EKG: pending  ASSESSMENT:   1. Hemoptysis 2. Pulmonary nodules 3. Acute on chronic diastolic CHF 4. AS s/p bioprosthetic AVR 5. Acute on CKD, stage III 6. PAF  - maintaining sinus rhythm, however mildly bradycardic 7. CAD s/p CABG 8. DM2 9. HTN 10. HLD  11. Hypokalemia   DISCUSSION/PLAN:  Patient reports a 2-3 week history of progressive SOB/DOE with associated cough, chills and subjective fever. She presents to the ED after experiencing hemoptysis this AM. She continues to take daily Xarelto for PAF and bioprosthetic AVR for thromboembolism prevention. In the ED, CXR and chest CT reveal RUL/LLL pulmonary nodules concerning for fungal infection, malignancy and/or PE. Pulmonology has been consulted. The latter is less likely given chronic anticoagulation. Her risk of stroke is high with concomitant PAF and prosthetic AVR, and would recommend continuing Xarelto. Additionally, pBNP is elevated and mild JVD is apparent on exam. Adventitious lung sounds are difficult to distinguish between likely respiratory infection (cough, SOB, nodules, leukocytosis) and pulmonary edema. Also, CXR does not indicate interstitial fluid accumulation. She does appear to be mildly volume overloaded, which may be driven by underlying infection. Lasix has been down-titrated as an OP (for LE edema) in the setting of CKD. Would favor trial of IV Lasix x 1 with output monitoring and to follow renal function (A/CKD may be secondary to volume overload). Diuretic adjustments and renal function will need to be monitored going forward. She is otherwise stable from an ischemic and arrhythmic standpoint. Would cycle CEs given endorsement of post-tussive chest burning. Hold Lopressor given bradycardia. Continue amiodarone.    Signed, R. Hurman Horn, PA-C 05/19/2012, 1:53 PM The patient was seen in  the emergency room.  She gives a history of increasing dyspnea associated with some cough productive of clear mucus over the past several weeks.  Last evening for the first time she had a small amount of hemoptysis with a deep cough.  Chest x-ray and CT scan were reviewed by me and suggests pulmonary etiology with differential diagnosis as noted in the CT report.  The patient does have symptoms of mild congestive  heart failure and  elevation of her pro B. natruretic peptide at 3599.  He has trace ankle edema and no jugular venous pressure may be slightly elevated. We will give one dose of IV Lasix and observe response in terms of her weight fluid output and followup a BMET tomorrow.  In view of systems sinus bradycardia we will stop her 12.5 mg of Toprol now and observe her grade on telemetry.  Agree with assessment and plan as noted above

## 2012-05-20 DIAGNOSIS — I2510 Atherosclerotic heart disease of native coronary artery without angina pectoris: Secondary | ICD-10-CM

## 2012-05-20 LAB — BASIC METABOLIC PANEL
BUN: 24 mg/dL — ABNORMAL HIGH (ref 6–23)
CO2: 20 mEq/L (ref 19–32)
Chloride: 103 mEq/L (ref 96–112)
Creatinine, Ser: 1.52 mg/dL — ABNORMAL HIGH (ref 0.50–1.10)

## 2012-05-20 LAB — TROPONIN I: Troponin I: <0.3 ng/mL (ref <0.30)

## 2012-05-20 MED ORDER — FUROSEMIDE 20 MG PO TABS: 20.0000 mg | ORAL_TABLET | Freq: Every day | ORAL | Status: DC

## 2012-05-20 MED ORDER — FUROSEMIDE 10 MG/ML IJ SOLN
40.0000 mg | Freq: Once | INTRAMUSCULAR | Status: AC
  Administered 2012-05-20: 40 mg via INTRAVENOUS
  Filled 2012-05-20: qty 4

## 2012-05-20 MED ORDER — HYDROCOD POLST-CHLORPHEN POLST 10-8 MG/5ML PO LQCR: 5.0000 mL | Freq: Two times a day (BID) | ORAL | Status: AC

## 2012-05-20 MED ORDER — LEVOFLOXACIN 250 MG PO TABS: 250.0000 mg | ORAL_TABLET | Freq: Every day | ORAL | Status: DC

## 2012-05-20 MED ORDER — CHLORHEXIDINE GLUCONATE CLOTH 2 % EX PADS: 6.0000 | MEDICATED_PAD | Freq: Every day | CUTANEOUS | Status: DC

## 2012-05-20 MED ORDER — RIVAROXABAN 10 MG PO TABS: 10.0000 mg | ORAL_TABLET | Freq: Every day | ORAL | Status: DC

## 2012-05-20 MED ORDER — MUPIROCIN 2 % EX OINT
1.0000 "application " | TOPICAL_OINTMENT | Freq: Two times a day (BID) | CUTANEOUS | Status: DC
  Administered 2012-05-20: 1 via NASAL
  Filled 2012-05-20: qty 22

## 2012-05-20 NOTE — Progress Notes (Signed)
TELEMETRY: Reviewed telemetry pt in NSR with occ PVCs in a pattern of bigeminy: Filed Vitals:   05/19/12 1840 05/19/12 2212 05/20/12 0149 05/20/12 0631  BP: 174/84 150/71 126/66 161/74  Pulse: 76 57 59 63  Temp: 98 F (36.7 C) 98.1 F (36.7 C) 98.5 F (36.9 C) 98.1 F (36.7 C)  TempSrc: Oral Oral Oral Oral  Resp: 18 18 17 18   Height: 5\' 4"  (1.626 m)     Weight: 96.5 kg (212 lb 11.9 oz)   96.026 kg (211 lb 11.2 oz)  SpO2: 98% 97% 96% 93%    Intake/Output Summary (Last 24 hours) at 05/20/12 1012 Last data filed at 05/20/12 0947  Gross per 24 hour  Intake    363 ml  Output      0 ml  Net    363 ml    SUBJECTIVE Cough is much less. No further hemoptysis. Nonproductive now.  LABS: Basic Metabolic Panel:  Basename 05/20/12 0625 05/19/12 0723  NA 138 138  K 4.1 3.2*  CL 103 102  CO2 20 22  GLUCOSE 122* 126*  BUN 24* 29*  CREATININE 1.52* 1.91*  CALCIUM 8.7 9.1  MG -- --  PHOS -- --   Liver Function Tests: No results found for this basename: AST:2,ALT:2,ALKPHOS:2,BILITOT:2,PROT:2,ALBUMIN:2 in the last 72 hours No results found for this basename: LIPASE:2,AMYLASE:2 in the last 72 hours CBC:  Basename 05/19/12 0624  WBC 10.6*  NEUTROABS 8.0*  HGB 11.7*  HCT 36.2  MCV 81.9  PLT 343   Cardiac Enzymes:  Basename 05/20/12 0810 05/20/12 0126 05/19/12 2042  CKTOTAL -- -- --  CKMB -- -- --  CKMBINDEX -- -- --  TROPONINI <0.30 <0.30 <0.30    Radiology/Studies:  Dg Chest 2 View  05/19/2012  *RADIOLOGY REPORT*  Clinical Data: hemoptysis starting this am with mid chest burning; SOB during activity; hx CAD, HTN, diabetic, CABG X3 years ago  CHEST - 2 VIEW  Comparison: 01/24/2012  Findings: Abnormal 2 cm density in the left mid lung.  Abnormal ill- defined abnormal densities in the left mid lung and left lung base on the frontal projection may represent nodules or early pneumonia. Mild scarring at the right lung base.  Prior median sternotomy and prior left proximal  humeral prosthesis.  Chronically blunted left hemidiaphragm posteriorly.  Valve prosthesis noted.  IMPRESSION:  1.  Vague densities in the left lung are concerning for early pneumonia or pulmonary nodules. Entities such as atypical/fungal infection or malignancy not excluded.  I recommend either chest CT (with contrast if feasible), or careful conventional radiographic follow-up. 2.  Chronically blunted left hemidiaphragm. 3.  Chronic scarring at the right lung base.   Original Report Authenticated By: Gaylyn Rong, M.D.    Ct Chest Wo Contrast  05/19/2012  *RADIOLOGY REPORT*  Clinical Data: Gives 2 week history of bronchitic sx associated with fatigue and cough with yellow phlegm. Coughed up quarter size amount of bloody phlegm x3 at 0200 this am and none since. Densities on chest radiography.  CT CHEST WITHOUT CONTRAST  Technique:  Multidetector CT imaging of the chest was performed following the standard protocol without IV contrast.  Comparison: 05/19/2012 chest radiograph  Findings: The patient did not receive IV contrast due to renal insufficiency.  Prior median sternotomy.  Coronary artery disease noted.  Small hiatal hernia.  No definite pathologic thoracic adenopathy.  The blood pool is less dense than the myocardium, suggesting anemia.  Left upper lobe pulmonary nodule with slightly spiculated margins  measures 1.4 x 1.1 cm, image 21 of series 3.  Faint halo of ground- glass opacity along its edges noted.  The wedge-shaped airspace occupying process in the left lower lobe measures 3.4 x 2.5 cm on image 34 of series 3.  Linear subsegmental atelectasis noted in the left lower lobe. There is also subsegmental atelectasis or scarring in the right lower lobe  No adrenal mass observed.  IMPRESSION:  1.  1.4 cm right upper lobe pulmonary nodule with a low of adjacent ground-glass opacity suggesting perinodular hemorrhage.  In the immunocompromised/neutropenic patient, the appearance would raise special  concern for angioinvasive fungal disease, most commonly Aspergillus. Serologic workup for Aspergillus/fungal disease may be warranted.  Tumor or other lesion with perilesional hemorrhage or infiltration can also cause this appearance. 2.  Wedge-shaped peripheral density in the left lower lobe without definite volume loss.  This also has a slight halo of hazy opacity along its margins.  Differential diagnostic considerations include pulmonary infarct with hemorrhage, fungal infection, or tumor. Given that the patient cannot receive IV contrast currently due to significant renal insufficiency, one might consider lower extremity Doppler venous ultrasound to assess for DVT, or nuclear medicine ventilation-perfusion lung scan to assess for other potential sites of pulmonary embolus as a concern.  If these lesions fail to respond to therapy, nuclear medicine PET CT or biopsy may be warranted. 3.  Hiatal hernia.   Original Report Authenticated By: Gaylyn Rong, M.D.     PHYSICAL EXAM General: Well developed, well nourished, in no acute distress. Head: Normocephalic, atraumatic, sclera non-icteric, no xanthomas, nares are without discharge. Neck: Negative for carotid bruits. JVD 6 cm Lungs: Mild expiratory wheezes.  Breathing is unlabored. Heart: RRR S1 S2 without murmurs, rubs, or gallops.  Abdomen: Soft, non-tender, non-distended with normoactive bowel sounds. No hepatomegaly. No rebound/guarding. No obvious abdominal masses. Msk:  Strength and tone appears normal for age. Extremities: No clubbing, cyanosis or edema.  Distal pedal pulses are 2+ and equal bilaterally. Neuro: Alert and oriented X 3. Moves all extremities spontaneously. Psych:  Responds to questions appropriately with a normal affect.  ASSESSMENT AND PLAN: 1. Hemoptysis. Resolved. Suspect related to airway irritation. 2. Chronic systolic CHF. Stable. 3. Paroxysmal Afib. Was bradycardic on admission. Improved with holding toprol.  4.  CKD stage 3. Creatinine decreased from 1.9>>1.5. May need to recalculate Xarelto dose. Calculated CrCl 36 which would argue for 15 mg dose. 5. S/p AVR with bioprosthetic valve.  Ok for discharge from cardiac standpoint. Patient states she has an appt. With Dr. Patty Sermons on Monday.  Principal Problem:  *Hemoptysis Active Problems:  CAD (coronary artery disease)  PAF (paroxysmal atrial fibrillation)  HTN (hypertension)  Chronic anticoagulation  Hx of CABG  S/P aortic valve replacement with bioprosthetic valve  Vitamin B12 deficiency  Bronchitis  Cough  CKD (chronic kidney disease) stage 3, GFR 30-59 ml/min  History of pulmonary embolus (PE)  Hypokalemia  Pulmonary nodule  Sinus bradycardia  Acute on chronic diastolic CHF (congestive heart failure), NYHA class 2    Signed, Peter [place] MD,FACC 05/20/2012 10:21 AM

## 2012-05-20 NOTE — Progress Notes (Signed)
CRITICAL VALUE ALERT  Critical value received:  Positive MRSA nares swab  Date of notification:  05/19/12  Time of notification:  2330  Critical value read back:yes  Nurse who received alert:  Lamone Ferrelli Naaman Plummer  MD notified (1st page):    Time of first page:    MD notified (2nd page):  Time of second page:  Responding MD:    Time MD responded:

## 2012-05-20 NOTE — Discharge Summary (Signed)
Physician Discharge Summary  Amber [name] ZOX:[id number] DOB: Aug 17, 1935 DOA: 05/19/2012  PCP: Amber Patella, MD  Admit date: 05/19/2012 Discharge date: 05/20/2012  Time spent: 45 minutes  Recommendations for Outpatient Follow-up:  1. patient is to followup with a pulmonary physician for the lung nodule  Discharge Diagnoses:  Hemoptysis - self-limited and resolved  CAD (coronary artery disease)  PAF (paroxysmal atrial fibrillation)  HTN (hypertension)  Chronic anticoagulation  Hx of CABG  S/P aortic valve replacement with bioprosthetic valve  Vitamin B12 deficiency  Bronchitis  Cough  CKD (chronic kidney disease) stage 3, GFR 30-59 ml/min  History of pulmonary embolus (PE)  Hypokalemia  Pulmonary nodule  Sinus bradycardia  Acute on chronic diastolic CHF (congestive heart failure), NYHA class 2   Discharge Condition: Good  Diet recommendation: Heart healthy  Filed Weights   05/19/12 1500 05/19/12 1840 05/20/12 0631  Weight: 98 kg (216 lb 0.8 oz) 96.5 kg (212 lb 11.9 oz) 96.026 kg (211 lb 11.2 oz)    History of present illness:  Amber [name] is a 76 y.o. female with hx of cabg and AVR, who presented to the ED today c/o 3 episodes of hemoptysis through the night. Patient also reports that she has been having significant coughing with yellow maroon sputum production for the past 3 weeks. The patient reports chills but she doesn't know she had a fever. Patient takes Xarelto for afib. The patient's daughter reports that [name] has had a difficult time with ambulation for the past months. She gets short of breath with walking for little distance.    Hospital Course:  1. Hemoptysis - resolved without recurrence or interventions. Pulmonary  consultant recommended cough suppressants for one week. Etiology is probably from bronchitis. We have decreased the Xarelto dose to account for the renal function.  2. bronchitis versus pneumonia-patient will receive one week of  antibiotics. We gave patient IV Rocephin and Zithromax for 2 days in the hospital and she'll take 5 days of Levaquin at home 3, bradycardia-with continued amiodarone but discontinued the metoprolol 4. acute on chronic diastolic congestive heart failure - patient received 2 doses of IV Lasix with good diuresis and improvement in her dyspnea.  5. incidental finding of a lung nodule on the chest CT - the patient will follow up with pulmonary as outpatient  Procedures: Chest CT  Consultations:  Pulmonary   Cardiology   Discharge Exam: Filed Vitals:   05/19/12 1840 05/19/12 2212 05/20/12 0149 05/20/12 0631  BP: 174/84 150/71 126/66 161/74  Pulse: 76 57 59 63  Temp: 98 F (36.7 C) 98.1 F (36.7 C) 98.5 F (36.9 C) 98.1 F (36.7 C)  TempSrc: Oral Oral Oral Oral  Resp: 18 18 17 18   Height: 5\' 4"  (1.626 m)     Weight: 96.5 kg (212 lb 11.9 oz)   96.026 kg (211 lb 11.2 oz)  SpO2: 98% 97% 96% 93%    General: Alert and oriented x3 Cardiovascular: Regular rate and rhythm Respiratory: Clear to auscultation bilaterally  Discharge Instructions  Discharge Orders    Future Appointments: Provider: Department: Dept Phone: Center:   05/22/2012 10:30 AM Cassell Clement, MD [place]) [phone] [email or web address]     Future Orders Please Complete By Expires   Diet - low sodium heart healthy      Increase activity slowly          Medication List     As of 05/20/2012  9:27 AM    STOP taking these  medications         metoprolol tartrate 25 MG tablet   Commonly known as: LOPRESSOR      TAKE these medications         ALPRAZolam 0.25 MG tablet   Commonly known as: XANAX   Take 0.25 mg by mouth 3 (three) times daily as needed. For anxiety      amiodarone 200 MG tablet   Commonly known as: PACERONE   Take 200 mg by mouth daily.      aspirin 81 MG tablet   Take 81 mg by mouth daily.      atorvastatin 40 MG tablet   Commonly known as: LIPITOR   TAKE 1  TABLET BY MOUTH EVERY DAY      CALCIUM 1200 PO   Take by mouth daily.      chlorpheniramine-HYDROcodone 10-8 MG/5ML Lqcr   Commonly known as: TUSSIONEX   Take 5 mLs by mouth every 12 (twelve) hours.      COLACE 100 MG capsule   Generic drug: docusate sodium   Take 100 mg by mouth daily. As needed      cyanocobalamin 1000 MCG/ML injection   Commonly known as: (VITAMIN B-12)   Inject 1 mL (1,000 mcg total) into the muscle every 30 (thirty) days.      famotidine 20 MG tablet   Commonly known as: PEPCID   TAKE 1 TABLET BY MOUTH TWICE A DAY      fluticasone 50 MCG/ACT nasal spray   Commonly known as: FLONASE   Place 1 spray into the nose daily.      furosemide 20 MG tablet   Commonly known as: LASIX   Take 1 tablet (20 mg total) by mouth daily.      levofloxacin 250 MG tablet   Commonly known as: LEVAQUIN   Take 1 tablet (250 mg total) by mouth daily.      losartan 100 MG tablet   Commonly known as: COZAAR   Take 1 tablet (100 mg total) by mouth daily.      meclizine 12.5 MG tablet   Commonly known as: ANTIVERT   Take 12.5 mg by mouth 3 (three) times daily as needed. For dizziness      nitroGLYCERIN 0.4 MG SL tablet   Commonly known as: NITROSTAT   Place 1 tablet (0.4 mg total) under the tongue every 5 (five) minutes as needed.      raloxifene 60 MG tablet   Commonly known as: EVISTA   Take 60 mg by mouth daily.      rivaroxaban 10 MG Tabs tablet   Commonly known as: XARELTO   Take 1 tablet (10 mg total) by mouth daily with supper.           Follow-up Information    Schedule an appointment as soon as possible for a visit with [facility].   Contact information:   [id number]      Schedule an appointment as soon as possible for a visit with Amber Patella, MD.   Contact information:   [facility] [address] [place] [address] [phone]           The results of significant diagnostics from this  hospitalization (including imaging, microbiology, ancillary and laboratory) are listed below for reference.    Significant Diagnostic Studies: Dg Chest 2 View  05/19/2012  *RADIOLOGY REPORT*  Clinical Data: hemoptysis starting this am with mid chest burning; SOB during activity; hx CAD, HTN,  diabetic, CABG X3 years ago  CHEST - 2 VIEW  Comparison: 01/24/2012  Findings: Abnormal 2 cm density in the left mid lung.  Abnormal ill- defined abnormal densities in the left mid lung and left lung base on the frontal projection may represent nodules or early pneumonia. Mild scarring at the right lung base.  Prior median sternotomy and prior left proximal humeral prosthesis.  Chronically blunted left hemidiaphragm posteriorly.  Valve prosthesis noted.  IMPRESSION:  1.  Vague densities in the left lung are concerning for early pneumonia or pulmonary nodules. Entities such as atypical/fungal infection or malignancy not excluded.  I recommend either chest CT (with contrast if feasible), or careful conventional radiographic follow-up. 2.  Chronically blunted left hemidiaphragm. 3.  Chronic scarring at the right lung base.   Original Report Authenticated By: Gaylyn Rong, M.D.    Ct Chest Wo Contrast  05/19/2012  *RADIOLOGY REPORT*  Clinical Data: Gives 2 week history of bronchitic sx associated with fatigue and cough with yellow phlegm. Coughed up quarter size amount of bloody phlegm x3 at 0200 this am and none since. Densities on chest radiography.  CT CHEST WITHOUT CONTRAST  Technique:  Multidetector CT imaging of the chest was performed following the standard protocol without IV contrast.  Comparison: 05/19/2012 chest radiograph  Findings: The patient did not receive IV contrast due to renal insufficiency.  Prior median sternotomy.  Coronary artery disease noted.  Small hiatal hernia.  No definite pathologic thoracic adenopathy.  The blood pool is less dense than the myocardium, suggesting anemia.  Left upper lobe  pulmonary nodule with slightly spiculated margins measures 1.4 x 1.1 cm, image 21 of series 3.  Faint halo of ground- glass opacity along its edges noted.  The wedge-shaped airspace occupying process in the left lower lobe measures 3.4 x 2.5 cm on image 34 of series 3.  Linear subsegmental atelectasis noted in the left lower lobe. There is also subsegmental atelectasis or scarring in the right lower lobe  No adrenal mass observed.  IMPRESSION:  1.  1.4 cm right upper lobe pulmonary nodule with a low of adjacent ground-glass opacity suggesting perinodular hemorrhage.  In the immunocompromised/neutropenic patient, the appearance would raise special concern for angioinvasive fungal disease, most commonly Aspergillus. Serologic workup for Aspergillus/fungal disease may be warranted.  Tumor or other lesion with perilesional hemorrhage or infiltration can also cause this appearance. 2.  Wedge-shaped peripheral density in the left lower lobe without definite volume loss.  This also has a slight halo of hazy opacity along its margins.  Differential diagnostic considerations include pulmonary infarct with hemorrhage, fungal infection, or tumor. Given that the patient cannot receive IV contrast currently due to significant renal insufficiency, one might consider lower extremity Doppler venous ultrasound to assess for DVT, or nuclear medicine ventilation-perfusion lung scan to assess for other potential sites of pulmonary embolus as a concern.  If these lesions fail to respond to therapy, nuclear medicine PET CT or biopsy may be warranted. 3.  Hiatal hernia.   Original Report Authenticated By: Gaylyn Rong, M.D.     Microbiology: Recent Results (from the past 240 hour(s))  MRSA PCR SCREENING     Status: Abnormal   Collection Time   05/19/12  7:45 PM      Component Value Range Status Comment   MRSA by PCR POSITIVE (*) NEGATIVE Final      Labs: Basic Metabolic Panel:  Lab 05/20/12 1610 05/19/12 0723  NA 138  138  K 4.1 3.2*  CL 103 102  CO2 20 22  GLUCOSE 122* 126*  BUN 24* 29*  CREATININE 1.52* 1.91*  CALCIUM 8.7 9.1  MG -- --  PHOS -- --   Liver Function Tests: No results found for this basename: AST:5,ALT:5,ALKPHOS:5,BILITOT:5,PROT:5,ALBUMIN:5 in the last 168 hours No results found for this basename: LIPASE:5,AMYLASE:5 in the last 168 hours No results found for this basename: AMMONIA:5 in the last 168 hours CBC:  Lab 05/19/12 0624  WBC 10.6*  NEUTROABS 8.0*  HGB 11.7*  HCT 36.2  MCV 81.9  PLT 343   Cardiac Enzymes:  Lab 05/20/12 0126 05/19/12 2042 05/19/12 0724  CKTOTAL -- -- --  CKMB -- -- --  CKMBINDEX -- -- --  TROPONINI <0.30 <0.30 <0.30   BNP: BNP (last 3 results)  Basename 05/19/12 0724  PROBNP 3599.0*   CBG: No results found for this basename: GLUCAP:5 in the last 168 hours     Signed:  Sadiyah Kangas  [facility] 05/20/2012, 9:27 AM

## 2012-05-22 ENCOUNTER — Ambulatory Visit (INDEPENDENT_AMBULATORY_CARE_PROVIDER_SITE_OTHER): Payer: Medicare Other | Admitting: Cardiology

## 2012-05-22 ENCOUNTER — Encounter: Payer: Self-pay | Admitting: Cardiology

## 2012-05-22 VITALS — BP 136/80 | HR 75 | Ht 64.0 in | Wt 212.6 lb

## 2012-05-22 DIAGNOSIS — I509 Heart failure, unspecified: Secondary | ICD-10-CM

## 2012-05-22 DIAGNOSIS — I4891 Unspecified atrial fibrillation: Secondary | ICD-10-CM

## 2012-05-22 DIAGNOSIS — R05 Cough: Secondary | ICD-10-CM

## 2012-05-22 DIAGNOSIS — I5033 Acute on chronic diastolic (congestive) heart failure: Secondary | ICD-10-CM

## 2012-05-22 DIAGNOSIS — I480 Paroxysmal atrial fibrillation: Secondary | ICD-10-CM

## 2012-05-22 DIAGNOSIS — Z7901 Long term (current) use of anticoagulants: Secondary | ICD-10-CM

## 2012-05-22 NOTE — Progress Notes (Signed)
Amber [name] Date of Birth:  1935/12/26 [facility] [address] [place], [place]  [address] [phone]         Fax   [phone]  History of Present Illness: This pleasant 76 year old woman is seen for a four-month followup office visit. . She has a past history of ischemic heart disease and a past history of aortic stenosis. She underwent CABG and aortic valve replacement with a tissue aortic valve on 01/10/09. She also has a past history of paroxysmal atrial fibrillation and is maintaining normal sinus rhythm on amiodarone 200 mg She is on Xarelto 20 mg daily for anticoagulation.  She was recently hospitalized for cough and hemoptysis.  She was admitted on 05/19/12 and discharged on 05/20/12.  While in the hospital she was noted to have marked sinus bradycardia and her low dose beta blocker was stopped and her amiodarone was continued.  While on telemetry she did not have any evidence of recurrent atrial fib.  The hospital workup did reveal question of a pulmonary nodule and she will be getting an outpatient followup appointment with [facility]   Current Outpatient Prescriptions  Medication Sig Dispense Refill  . ALPRAZolam (XANAX) 0.25 MG tablet Take 0.25 mg by mouth 3 (three) times daily as needed. For anxiety      . amiodarone (PACERONE) 200 MG tablet Take 200 mg by mouth daily.      Marland Kitchen aspirin 81 MG tablet Take 81 mg by mouth daily.        Marland Kitchen atorvastatin (LIPITOR) 40 MG tablet TAKE 1 TABLET BY MOUTH EVERY DAY  30 tablet  6  . Calcium Carbonate-Vit D-Min (CALCIUM 1200 PO) Take by mouth daily.        . chlorpheniramine-HYDROcodone (TUSSIONEX) 10-8 MG/5ML LQCR Take 5 mLs by mouth every 12 (twelve) hours.  115 mL  0  . cyanocobalamin (,VITAMIN B-12,) 1000 MCG/ML injection Inject 1 mL (1,000 mcg total) into the muscle every 30 (thirty) days.  30 mL  1  . docusate sodium (COLACE) 100 MG capsule Take 100 mg by mouth daily. As needed  10 capsule  0  . famotidine  (PEPCID) 20 MG tablet TAKE 1 TABLET BY MOUTH TWICE A DAY  60 tablet  12  . fluticasone (FLONASE) 50 MCG/ACT nasal spray Place 1 spray into the nose daily.       . furosemide (LASIX) 20 MG tablet Take 1 tablet (20 mg total) by mouth daily.  60 tablet  5  . levofloxacin (LEVAQUIN) 250 MG tablet Take 1 tablet (250 mg total) by mouth daily.  7 tablet  0  . losartan (COZAAR) 100 MG tablet Take 1 tablet (100 mg total) by mouth daily.  30 tablet  12  . meclizine (ANTIVERT) 12.5 MG tablet Take 12.5 mg by mouth 3 (three) times daily as needed. For dizziness      . nitroGLYCERIN (NITROSTAT) 0.4 MG SL tablet Place 1 tablet (0.4 mg total) under the tongue every 5 (five) minutes as needed.  25 tablet  prn  . raloxifene (EVISTA) 60 MG tablet Take 60 mg by mouth daily.        . rivaroxaban (XARELTO) 10 MG TABS tablet Take 1 tablet (10 mg total) by mouth daily with supper.  30 tablet  0    Allergies  Allergen Reactions  . Lisinopril Cough  . Metformin     nausea  . Fluconazole Rash    Patient Active Problem List  Diagnosis  . CAD (  coronary artery disease)  . PAF (paroxysmal atrial fibrillation)  . HTN (hypertension)  . Chronic anticoagulation  . Hx of CABG  . S/P aortic valve replacement with bioprosthetic valve  . Vitamin B12 deficiency  . Bronchitis  . Cough  . CKD (chronic kidney disease) stage 3, GFR 30-59 ml/min  . Hemoptysis  . History of pulmonary embolus (PE)  . Iron deficiency anemia  . Hypokalemia  . Pulmonary nodule  . Sinus bradycardia  . Acute on chronic diastolic CHF (congestive heart failure), NYHA class 2    History  Smoking status  . Never Smoker   Smokeless tobacco  . Never Used    History  Alcohol Use No    Family History  Problem Relation Age of Onset  . Stroke Mother   . Stroke Father   . Heart disease Brother   . Heart disease Brother     Review of Systems: Constitutional: no fever chills diaphoresis or fatigue or change in weight.  Head and neck: no  hearing loss, no epistaxis, no photophobia or visual disturbance. Respiratory: No cough, shortness of breath or wheezing. Cardiovascular: No chest pain peripheral edema, palpitations. Gastrointestinal: No abdominal distention, no abdominal pain, no change in bowel habits hematochezia or melena. Genitourinary: No dysuria, no frequency, no urgency, no nocturia. Musculoskeletal:No arthralgias, no back pain, no gait disturbance or myalgias. Neurological: No dizziness, no headaches, no numbness, no seizures, no syncope, no weakness, no tremors. Hematologic: No lymphadenopathy, no easy bruising. Psychiatric: No confusion, no hallucinations, no sleep disturbance.    Physical Exam: Filed Vitals:   05/22/12 1100  BP: 136/80  Pulse: 75   the general appearance reveals a well-developed well-nourished woman in no distress.The head and neck exam reveals pupils equal and reactive.  Extraocular movements are full.  There is no scleral icterus.  The mouth and pharynx are normal.  The neck is supple.  The carotids reveal no bruits.  The jugular venous pressure is normal.  The  thyroid is not enlarged.  There is no lymphadenopathy.  The chest reveals mild expiratory rhonchi rhonchi.  Expansion of the chest is symmetrical.  The precordium is quiet.  The first heart sound is normal.  The second heart sound is physiologically split.  There is no murmur gallop rub or click.  There is no abnormal lift or heave.  The abdomen is soft and nontender.  The bowel sounds are normal.  The liver and spleen are not enlarged.  There are no abdominal masses.  There are no abdominal bruits.  Extremities reveal good pedal pulses.  There is no phlebitis or edema.  There is no cyanosis or clubbing.  Strength is normal and symmetrical in all extremities.  There is no lateralizing weakness.  There are no sensory deficits.  The skin is warm and dry.  There is no rash.  EKG confirms normal sinus rhythm with sinus arrhythmia and no acute  ischemic changes  Assessment / Plan: Continue same medication.  She cannot afford Tussionex for cough and she will use Robitussin DM over-the-counter every 4-6 hours when necessary instead. Recheck here in 4 months for followup office visit and EKG

## 2012-05-22 NOTE — Assessment & Plan Note (Signed)
Patient remains on Xarelto and her dose was reduced because of renal insufficiency and that she is now on 10 mg daily rather than 20 mg.  she has had no further hemoptysis

## 2012-05-22 NOTE — Assessment & Plan Note (Signed)
She has not had any recurrent atrial fibrillation. 

## 2012-05-22 NOTE — Patient Instructions (Signed)
Your physician recommends that you continue on your current medications as directed. Please refer to the Current Medication list given to you today.  Try Robitussin DM every 4 hours as needed for cough   Your physician wants you to follow-up in: 4 month ov ekg You will receive a reminder letter in the mail two months in advance. If you don't receive a letter, please call our office to schedule the follow-up appointment.

## 2012-05-22 NOTE — Assessment & Plan Note (Signed)
The patient has chronic dyspnea which is no worse.  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

## 2012-05-23 ENCOUNTER — Other Ambulatory Visit: Payer: Self-pay

## 2012-05-23 MED ORDER — FAMOTIDINE 20 MG PO TABS: 20.0000 mg | ORAL_TABLET | Freq: Two times a day (BID) | ORAL | Status: DC

## 2012-05-23 NOTE — Progress Notes (Signed)
Utilization Review Completed.   Jersi Mcmaster, RN, BSN Nurse Case Manager  [phone]  

## 2012-05-24 ENCOUNTER — Other Ambulatory Visit: Payer: Self-pay

## 2012-05-24 MED ORDER — FAMOTIDINE 20 MG PO TABS: 20.0000 mg | ORAL_TABLET | Freq: Two times a day (BID) | ORAL | Status: DC

## 2012-05-25 LAB — CULTURE, BLOOD (ROUTINE X 2): Culture: NO GROWTH

## 2012-06-19 ENCOUNTER — Encounter: Payer: Self-pay | Admitting: Adult Health

## 2012-06-19 ENCOUNTER — Ambulatory Visit (INDEPENDENT_AMBULATORY_CARE_PROVIDER_SITE_OTHER)
Admission: RE | Admit: 2012-06-19 | Discharge: 2012-06-19 | Disposition: A | Discharge status: Home / Self Care | Payer: Medicare Other | Source: Ambulatory Visit | Attending: Adult Health | Admitting: Adult Health

## 2012-06-19 ENCOUNTER — Ambulatory Visit (INDEPENDENT_AMBULATORY_CARE_PROVIDER_SITE_OTHER): Payer: Medicare Other | Admitting: Adult Health

## 2012-06-19 VITALS — BP 114/66 | HR 77 | Temp 97.1°F | Ht 64.0 in | Wt 216.4 lb

## 2012-06-19 DIAGNOSIS — R042 Hemoptysis: Secondary | ICD-10-CM

## 2012-06-19 DIAGNOSIS — R911 Solitary pulmonary nodule: Secondary | ICD-10-CM

## 2012-06-19 NOTE — Assessment & Plan Note (Signed)
Resolved -suspect related to bronchitis vs PNA in setting of Xarelto  follow up Ct pending to follow lung nodule

## 2012-06-19 NOTE — Patient Instructions (Signed)
We are setting you up for a .follow up CT chest to follow the lung nodule in 2 weeks  Mucinex DM Twice daily As needed  Cough/congestion  Saline nasal rinses As needed   follow up Dr. Vassie Loll  Or Sood in 4 -6 weeks  Please contact office for sooner follow up if symptoms do not improve or worsen or seek emergency care

## 2012-06-19 NOTE — Progress Notes (Signed)
Subjective:    Patient ID: Amber [name], female    DOB: [birth year], 77 y.o.   MRN: [id number]  HPI 77 year old female never smoker, seen for pulmonary consultation during hospitalization. December 6 through December 7 , 2013 for hemoptysis and lung nodule in the setting of Xarelto .  CT chest 05/19/12 > 1.4 cm right upper lobe pulmonary nodule with adjacent groundglass opacity w/ nodular hemorrhage, and a wedge shaped peripheral density in the left lower lobe with hazy opacity along its margins.  06/19/2012 Post Hospital follow up  Patient presents for a one-month followup from hospitalization Patient was admitted December 6 , 2013 for hemoptysis . It was felt that this was due to probable bronchitis versus pneumonia in the setting of Xarelto for  A. Fib) .  Subsequent CT chest showed a 1.4 cm right upper lobe pulmonary nodule.  Pt was felt to be low risk for malignancy in never smoker.   Since discharge she is feeling better   reports breathing is doing well but does c/o prod cough with white mucus, head congestion/HA, runny nose, PND x1 week - denies f/c/s, wheezing, SOB, tightness No hemoptysis since discharge  Xray today shows Almost complete clearing of previously described left mid and lower lung nodular opacities suggesting an inflammatory process.     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  Widow  No unusual hobbies or pets  No extenisve travel.  No previous XRT .     Review of Systems Constitutional:   No  weight loss, night sweats,  Fevers, chills, fatigue, or  lassitude.  HEENT:   No headaches,  Difficulty swallowing,  Tooth/dental problems, or  Sore throat,                No sneezing, itching, ear ache,  +nasal congestion, post nasal drip,   CV:  No chest pain,  Orthopnea, PND, swelling in lower extremities, anasarca, dizziness, palpitations, syncope.   GI  No heartburn, indigestion, abdominal pain, nausea, vomiting, diarrhea, change in  bowel habits, loss of appetite, bloody stools.   Resp:    No coughing up of blood.  No change in color of mucus.  No wheezing.  No chest wall deformity  Skin: no rash or lesions.  GU: no dysuria, change in color of urine, no urgency or frequency.  No flank pain, no hematuria   MS:  No joint pain or swelling.  No decreased range of motion.  No back pain.  Psych:  No change in mood or affect. No depression or anxiety.  No memory loss.         Objective:   Physical Exam GEN: A/Ox3; pleasant , NAD  HEENT:  [place]/AT,  EACs-clear, TMs-wnl, NOSE-clear, THROAT-clear, no lesions, no postnasal drip or exudate noted.   NECK:  Supple w/ fair ROM; no JVD; normal carotid impulses w/o bruits; no thyromegaly or nodules palpated; no lymphadenopathy.  RESP  Clear  P & A; w/o, wheezes/ rales/ or rhonchi.no accessory muscle use, no dullness to percussion  CARD:  RRR, no m/r/g  , no peripheral edema, pulses intact, no cyanosis or clubbing.  GI:   Soft & nt; nml bowel sounds; no organomegaly or masses detected.  Musco: Warm bil, no deformities or joint swelling noted.   Neuro: alert, no focal deficits noted.    Skin: Warm, no lesions or rashes    CXR 06/19/2012 >Almost complete clearing of previously described left mid and lower lung nodular  opacities suggesting an inflammatory process.       Assessment & Plan:

## 2012-06-19 NOTE — Progress Notes (Signed)
Agree with plan 

## 2012-06-19 NOTE — Assessment & Plan Note (Signed)
Set up for Ct chest w/o contrast (renal insuff) in 2 weeks

## 2012-06-21 ENCOUNTER — Other Ambulatory Visit: Payer: Self-pay | Admitting: *Deleted

## 2012-06-21 MED ORDER — ATORVASTATIN CALCIUM 40 MG PO TABS: 40.0000 mg | ORAL_TABLET | Freq: Every day | ORAL | Status: DC

## 2012-06-23 ENCOUNTER — Telehealth: Payer: Self-pay | Admitting: Cardiology

## 2012-06-23 DIAGNOSIS — I4891 Unspecified atrial fibrillation: Secondary | ICD-10-CM

## 2012-06-23 MED ORDER — RIVAROXABAN 15 MG PO TABS: 15.0000 mg | ORAL_TABLET | Freq: Every day | ORAL | Status: DC

## 2012-06-23 NOTE — Telephone Encounter (Signed)
Pt needs refill of xaerlto was on 20mg , then hospital changed to 10mg , not sure which dose she needs, uses [facility]

## 2012-06-23 NOTE — Telephone Encounter (Signed)
Advised patient

## 2012-06-23 NOTE — Telephone Encounter (Signed)
Will forward to  Dr. Brackbill for review 

## 2012-06-23 NOTE — Telephone Encounter (Signed)
And because her renal insufficiency is present with a creatinine clearance of about 35 cc she should be on Xarelto 15 mg daily

## 2012-07-03 ENCOUNTER — Ambulatory Visit (INDEPENDENT_AMBULATORY_CARE_PROVIDER_SITE_OTHER)
Admission: RE | Admit: 2012-07-03 | Discharge: 2012-07-03 | Disposition: A | Discharge status: Home / Self Care | Payer: Medicare Other | Source: Ambulatory Visit | Attending: Adult Health | Admitting: Adult Health

## 2012-07-03 ENCOUNTER — Encounter: Payer: Self-pay | Admitting: Adult Health

## 2012-07-03 DIAGNOSIS — R911 Solitary pulmonary nodule: Secondary | ICD-10-CM

## 2012-07-12 ENCOUNTER — Other Ambulatory Visit: Payer: Self-pay

## 2012-07-12 MED ORDER — FUROSEMIDE 20 MG PO TABS: 20.0000 mg | ORAL_TABLET | Freq: Every day | ORAL | Status: DC

## 2012-07-13 ENCOUNTER — Other Ambulatory Visit: Payer: Self-pay

## 2012-07-13 MED ORDER — FUROSEMIDE 20 MG PO TABS: 20.0000 mg | ORAL_TABLET | Freq: Every day | ORAL | Status: DC

## 2012-07-17 ENCOUNTER — Encounter: Payer: Self-pay | Admitting: Pulmonary Disease

## 2012-07-17 ENCOUNTER — Ambulatory Visit (INDEPENDENT_AMBULATORY_CARE_PROVIDER_SITE_OTHER): Payer: Medicare Other | Admitting: Pulmonary Disease

## 2012-07-17 VITALS — BP 126/62 | HR 75 | Temp 97.5°F | Ht 65.0 in | Wt 217.2 lb

## 2012-07-17 DIAGNOSIS — R911 Solitary pulmonary nodule: Secondary | ICD-10-CM

## 2012-07-17 NOTE — Patient Instructions (Signed)
Nodule is resolving on CT scan - likely related to bleeding- no FU scans required Can take delsym at night & mucinex in daytime Tylenol for headache

## 2012-07-17 NOTE — Assessment & Plan Note (Signed)
CT chest 05/19/12 >1.4 x 1.1 cm LUL nodule and LLL wedge aspdz  >tx w/ abx for possible PNA  Follow up CT chest 07/03/2012 >>>Resolving left upper lobe nodule,and  resolving area of peripheral air space consolidation in the left lower lobe  Favor aspiration blood vs infectious/ inflammatory process - no further FU required unless new symptoms

## 2012-07-17 NOTE — Progress Notes (Signed)
  Subjective:    Patient ID: Amber [name], female    DOB: 07-10-1935, 76 y.o.   MRN: [id number]  HPI PCP - Reade 77 year old female never smoker, seen for pulmonary consultation during hospitalization. December 6 through December 7 , 2013 for hemoptysis and lung nodule in the setting of Xarelto .  CT chest 05/19/12 > 1.4 cm right upper lobe pulmonary nodule with adjacent groundglass opacity w/ nodular hemorrhage, and a wedge shaped peripheral density in the left lower lobe with hazy opacity along its margins.   07/17/2012  Since discharge she is feeling better   reports breathing is doing well but does c/o prod cough with white mucus, head congestion/HA, runny nose, PND x1 week - denies f/c/s, wheezing, SOB, tightness No hemoptysis since discharge  Xray 1/14 showed Almost complete clearing of previously described left mid and lower lung nodular opacities suggesting an inflammatory process.  FU CT - Resolving left upper lobe nodule, as above, most compatible  with aspirated blood.Also, resolving area of peripheral air space consolidation in the left lower lobe, which now has an appearance most compatible with evolving scar   Pt reports breathing is worse, cough w/ white phlem, slight wheezing, chest tx, nasal congestion, runny nose, PND, hoarseness x 1 week. denies any fever, chills, sweats.   Review of Systems neg for any significant sore throat, dysphagia, itching, sneezing, nasal congestion or excess/ purulent secretions, fever, chills, sweats, unintended wt loss, pleuritic or exertional cp, hempoptysis, orthopnea pnd or change in chronic leg swelling. Also denies presyncope, palpitations, heartburn, abdominal pain, nausea, vomiting, diarrhea or change in bowel or urinary habits, dysuria,hematuria, rash, arthralgias, visual complaints, headache, numbness weakness or ataxia.     Objective:   Physical Exam  Gen. Pleasant, obese, in no distress ENT - no lesions, no post nasal drip Neck: No  JVD, no thyromegaly, no carotid bruits Lungs: no use of accessory muscles, no dullness to percussion, decreased without rales or rhonchi  Cardiovascular: Rhythm regular, heart sounds  normal, no murmurs or gallops, no peripheral edema Musculoskeletal: No deformities, no cyanosis or clubbing , no tremors        Assessment & Plan:

## 2012-09-20 ENCOUNTER — Other Ambulatory Visit: Payer: Self-pay | Admitting: *Deleted

## 2012-09-20 MED ORDER — AMIODARONE HCL 200 MG PO TABS: 200.0000 mg | ORAL_TABLET | Freq: Every day | ORAL | Status: DC

## 2012-09-25 ENCOUNTER — Ambulatory Visit (INDEPENDENT_AMBULATORY_CARE_PROVIDER_SITE_OTHER): Payer: Medicare Other | Admitting: Cardiology

## 2012-09-25 ENCOUNTER — Encounter: Payer: Self-pay | Admitting: Cardiology

## 2012-09-25 VITALS — BP 142/76 | HR 68 | Ht 65.0 in | Wt 219.8 lb

## 2012-09-25 DIAGNOSIS — Z953 Presence of xenogenic heart valve: Secondary | ICD-10-CM

## 2012-09-25 DIAGNOSIS — E7800 Pure hypercholesterolemia, unspecified: Secondary | ICD-10-CM

## 2012-09-25 DIAGNOSIS — R5381 Other malaise: Secondary | ICD-10-CM

## 2012-09-25 DIAGNOSIS — R5383 Other fatigue: Secondary | ICD-10-CM

## 2012-09-25 DIAGNOSIS — I2510 Atherosclerotic heart disease of native coronary artery without angina pectoris: Secondary | ICD-10-CM

## 2012-09-25 DIAGNOSIS — I4891 Unspecified atrial fibrillation: Secondary | ICD-10-CM

## 2012-09-25 DIAGNOSIS — Z952 Presence of prosthetic heart valve: Secondary | ICD-10-CM

## 2012-09-25 DIAGNOSIS — I480 Paroxysmal atrial fibrillation: Secondary | ICD-10-CM

## 2012-09-25 LAB — HEPATIC FUNCTION PANEL
ALT: 16 U/L (ref 0–35)
Alkaline Phosphatase: 109 U/L (ref 39–117)
Bilirubin, Direct: 0.1 mg/dL (ref 0.0–0.3)
Total Protein: 7.5 g/dL (ref 6.0–8.3)

## 2012-09-25 LAB — CBC WITH DIFFERENTIAL/PLATELET
Eosinophils Relative: 2.4 % (ref 0.0–5.0)
MCV: 79.2 fl (ref 78.0–100.0)
Monocytes Absolute: 0.4 10*3/uL (ref 0.1–1.0)
Neutrophils Relative %: 76.7 % (ref 43.0–77.0)
Platelets: 305 10*3/uL (ref 150.0–400.0)
WBC: 8.4 10*3/uL (ref 4.5–10.5)

## 2012-09-25 LAB — BASIC METABOLIC PANEL
BUN: 22 mg/dL (ref 6–23)
Chloride: 104 mEq/L (ref 96–112)
Creatinine, Ser: 1.4 mg/dL — ABNORMAL HIGH (ref 0.4–1.2)
GFR: 39.02 mL/min — ABNORMAL LOW (ref >60.00)

## 2012-09-25 LAB — LIPID PANEL
HDL: 64.6 mg/dL (ref >39.00)
LDL Cholesterol: 88 mg/dL (ref 0–99)
Total CHOL/HDL Ratio: 3
Triglycerides: 116 mg/dL (ref 0.0–149.0)
VLDL: 23.2 mg/dL (ref 0.0–40.0)

## 2012-09-25 LAB — IBC PANEL: Iron: 40 ug/dL — ABNORMAL LOW (ref 42–145)

## 2012-09-25 LAB — T4, FREE: Free T4: 1.21 ng/dL (ref 0.60–1.60)

## 2012-09-25 LAB — TSH: TSH: 2.63 u[IU]/mL (ref 0.35–5.50)

## 2012-09-25 NOTE — Patient Instructions (Signed)
Will obtain labs today and call you with the results (lp/bmet/hfp/tsh/t4/cbc/b12/tibc/ferritin)  Your physician recommends that you continue on your current medications as directed. Please refer to the Current Medication list given to you today  Your physician recommends that you schedule a follow-up appointment in: 4 month ov

## 2012-09-25 NOTE — Assessment & Plan Note (Signed)
The patient has been more tired.  She has been nervous.  She is stressed out about having to go to [place] this weekend for a wedding.  The patient has a past history of anemia and we will update her labs with a CBC and an anemia panel.  In view of her being on long-term amiodarone we will also check thyroid function and liver function.

## 2012-09-25 NOTE — Assessment & Plan Note (Signed)
The patient is not having any symptoms from her aortic valve replacement.  She denies any exertional chest pain or symptoms of CHF.  No exertional dizziness or syncope.  No fever or chills or constitutional symptoms

## 2012-09-25 NOTE — Assessment & Plan Note (Signed)
The patient has had no recurrent angina or nitroglycerin use

## 2012-09-25 NOTE — Assessment & Plan Note (Signed)
The patient has not been experiencing any recurrent atrial fibrillation that she has been aware of.  EKG today confirms normal sinus rhythm at 68 per minute.

## 2012-09-25 NOTE — Progress Notes (Signed)
Amber [name] Date of Birth:  [birth year] [facility] [address] [place], [place]  [address] [phone]         Fax   [phone]  History of Present Illness: This pleasant 77 year old woman is seen for a four-month followup office visit. . She has a past history of ischemic heart disease and a past history of aortic stenosis. She underwent CABG and aortic valve replacement with a tissue aortic valve on 01/10/09. She also has a past history of paroxysmal atrial fibrillation and is maintaining normal sinus rhythm on amiodarone 200 mg She is on Xarelto 20 mg daily for anticoagulation. She was recently hospitalized for cough and hemoptysis. She was admitted on 05/19/12 and discharged on 05/20/12. While in the hospital she was noted to have marked sinus bradycardia and her low dose beta blocker was stopped and her amiodarone was continued. While on telemetry she did not have any evidence of recurrent atrial fib.  The patient has a history of gout and is on medication to prevent acute gout flareup.  The patient has been complaining of increased malaise and fatigue.  She has a past history of iron deficiency anemia several years ago.   Current Outpatient Prescriptions  Medication Sig Dispense Refill  . ALPRAZolam (XANAX) 0.25 MG tablet Take 0.25 mg by mouth 3 (three) times daily as needed. For anxiety      . amiodarone (PACERONE) 200 MG tablet Take 1 tablet (200 mg total) by mouth daily.  30 tablet  3  . aspirin 81 MG tablet Take 81 mg by mouth daily.        Marland Kitchen atorvastatin (LIPITOR) 40 MG tablet Take 1 tablet (40 mg total) by mouth daily.  30 tablet  6  . Calcium Carbonate-Vit D-Min (CALCIUM 1200 PO) Take by mouth daily.        . colchicine 0.6 MG tablet Take 0.6 mg by mouth as needed.      . cyanocobalamin (,VITAMIN B-12,) 1000 MCG/ML injection Inject 1 mL (1,000 mcg total) into the muscle every 30 (thirty) days.  30 mL  1  . dextromethorphan (DELSYM) 30 MG/5ML liquid Take 60  mg by mouth as needed.      . docusate sodium (COLACE) 100 MG capsule Take 100 mg by mouth daily. As needed  10 capsule  0  . famotidine (PEPCID) 20 MG tablet Take 1 tablet (20 mg total) by mouth 2 (two) times daily.  60 tablet  12  . fluticasone (FLONASE) 50 MCG/ACT nasal spray Place 1 spray into the nose daily.       . furosemide (LASIX) 20 MG tablet Take 1 tablet (20 mg total) by mouth daily.  60 tablet  5  . losartan (COZAAR) 100 MG tablet Take 100 mg by mouth daily.      . meclizine (ANTIVERT) 12.5 MG tablet Take 12.5 mg by mouth 3 (three) times daily as needed. For dizziness      . nitroGLYCERIN (NITROSTAT) 0.4 MG SL tablet Place 1 tablet (0.4 mg total) under the tongue every 5 (five) minutes as needed.  25 tablet  prn  . raloxifene (EVISTA) 60 MG tablet Take 60 mg by mouth daily.        . rivaroxaban (XARELTO) 15 MG TABS tablet Take 1 tablet (15 mg total) by mouth daily with supper.  30 tablet  5   No current facility-administered medications for this visit.    Allergies  Allergen Reactions  . Lisinopril Cough  .  Metformin     nausea  . Fluconazole Rash    Patient Active Problem List  Diagnosis  . CAD (coronary artery disease)  . PAF (paroxysmal atrial fibrillation)  . HTN (hypertension)  . Chronic anticoagulation  . Hx of CABG  . S/P aortic valve replacement with bioprosthetic valve  . Vitamin B12 deficiency  . Bronchitis  . Cough  . CKD (chronic kidney disease) stage 3, GFR 30-59 ml/min  . Hemoptysis  . History of pulmonary embolus (PE)  . Iron deficiency anemia  . Hypokalemia  . Pulmonary nodule  . Sinus bradycardia  . Acute on chronic diastolic CHF (congestive heart failure), NYHA class 2  . Malaise and fatigue    History  Smoking status  . Never Smoker   Smokeless tobacco  . Never Used    History  Alcohol Use No    Family History  Problem Relation Age of Onset  . Stroke Mother   . Stroke Father   . Heart disease Brother   . Heart disease Brother      Review of Systems: Constitutional: no fever chills diaphoresis or fatigue or change in weight.  Head and neck: no hearing loss, no epistaxis, no photophobia or visual disturbance. Respiratory: No cough, shortness of breath or wheezing. Cardiovascular: No chest pain peripheral edema, palpitations. Gastrointestinal: No abdominal distention, no abdominal pain, no change in bowel habits hematochezia or melena. Genitourinary: No dysuria, no frequency, no urgency, no nocturia. Musculoskeletal:No arthralgias, no back pain, no gait disturbance or myalgias. Neurological: No dizziness, no headaches, no numbness, no seizures, no syncope, no weakness, no tremors. Hematologic: No lymphadenopathy, no easy bruising. Psychiatric: No confusion, no hallucinations, no sleep disturbance.    Physical Exam: Filed Vitals:   09/25/12 1146  BP: 142/76  Pulse: 68   the general appearance reveals a well-developed well-nourished woman in no distress.  She complains that her hair is falling out and she wonders if she might be anemic or hypothyroid.The head and neck exam reveals pupils equal and reactive.  Extraocular movements are full.  There is no scleral icterus.  The mouth and pharynx are normal.  The neck is supple.  The carotids reveal no bruits.  The jugular venous pressure is normal.  The  thyroid is not enlarged.  There is no lymphadenopathy.  The chest is clear to percussion and auscultation.  There are no rales or rhonchi.  Expansion of the chest is symmetrical.  The precordium is quiet.  The first heart sound is normal.  The second heart sound is physiologically split.  There is no  gallop rub or click.  There is a soft systolic murmur across the prosthetic aortic valve.  No diastolic murmur.  There is no abnormal lift or heave.  The abdomen is soft and nontender.  The bowel sounds are normal.  The liver and spleen are not enlarged.  There are no abdominal masses.  There are no abdominal bruits.   Extremities reveal good pedal pulses.  There is no phlebitis or edema.  There is no cyanosis or clubbing.  Strength is normal and symmetrical in all extremities.  There is no lateralizing weakness.  There are no sensory deficits.  The skin is warm and dry.  There is no rash.  EKG today shows normal sinus rhythm and nonspecific T-wave flattening and poor R-wave progression V1 through V2.   Assessment / Plan: Continue same medications.  Await results of lab work regarding possible anemia or hypothyroidism. Recheck in 4 months for  followup office visit.

## 2012-09-26 ENCOUNTER — Telehealth: Payer: Self-pay | Admitting: *Deleted

## 2012-09-26 NOTE — Telephone Encounter (Signed)
Advised patient of lab results, mailed patient copy, and sent to Dr Nicholos Johns

## 2012-09-26 NOTE — Telephone Encounter (Signed)
Message copied by Burnell Blanks on Tue Sep 26, 2012  9:53 AM ------      Message from: Cassell Clement      Created: Tue Sep 26, 2012  8:04 AM       Please report.  The liver function studies are normal.  The basal metabolic panel is satisfactory.  Kidney function is improved.  She is mildly anemic with hemoglobin 11.4.  Her iron levels and her ferritin levels are low consistent with iron deficiency anemia.  I would like her to follow regarding the iron deficiency anemia with Dr. Elias Else.  She will need stool guaiacs to rule out slow GI bleeding since she is on Xarelto.  She apparently has had a history of iron deficiency anemia in the past.  At this point I will have her start over-the-counter iron one daily.      Lipids are normal.      Thyroid studies are normal      B12 levels are normal.      Send a copy of office visit and labs and this note to Dr. Elias Else ------

## 2012-11-11 ENCOUNTER — Emergency Department (HOSPITAL_COMMUNITY): Payer: Medicare Other

## 2012-11-11 ENCOUNTER — Encounter (HOSPITAL_COMMUNITY): Payer: Self-pay | Admitting: Emergency Medicine

## 2012-11-11 ENCOUNTER — Emergency Department (HOSPITAL_COMMUNITY)
Admission: EM | Admit: 2012-11-11 | Discharge: 2012-11-11 | Disposition: A | Discharge status: Home / Self Care | Payer: Medicare Other | Attending: Emergency Medicine | Admitting: Emergency Medicine

## 2012-11-11 DIAGNOSIS — Z954 Presence of other heart-valve replacement: Secondary | ICD-10-CM | POA: Insufficient documentation

## 2012-11-11 DIAGNOSIS — M1990 Unspecified osteoarthritis, unspecified site: Secondary | ICD-10-CM | POA: Insufficient documentation

## 2012-11-11 DIAGNOSIS — I779 Disorder of arteries and arterioles, unspecified: Secondary | ICD-10-CM | POA: Insufficient documentation

## 2012-11-11 DIAGNOSIS — R209 Unspecified disturbances of skin sensation: Secondary | ICD-10-CM | POA: Insufficient documentation

## 2012-11-11 DIAGNOSIS — Z79899 Other long term (current) drug therapy: Secondary | ICD-10-CM | POA: Insufficient documentation

## 2012-11-11 DIAGNOSIS — Z888 Allergy status to other drugs, medicaments and biological substances status: Secondary | ICD-10-CM | POA: Insufficient documentation

## 2012-11-11 DIAGNOSIS — I10 Essential (primary) hypertension: Secondary | ICD-10-CM | POA: Insufficient documentation

## 2012-11-11 DIAGNOSIS — K219 Gastro-esophageal reflux disease without esophagitis: Secondary | ICD-10-CM | POA: Insufficient documentation

## 2012-11-11 DIAGNOSIS — E119 Type 2 diabetes mellitus without complications: Secondary | ICD-10-CM | POA: Insufficient documentation

## 2012-11-11 DIAGNOSIS — E785 Hyperlipidemia, unspecified: Secondary | ICD-10-CM | POA: Insufficient documentation

## 2012-11-11 DIAGNOSIS — I2510 Atherosclerotic heart disease of native coronary artery without angina pectoris: Secondary | ICD-10-CM | POA: Insufficient documentation

## 2012-11-11 DIAGNOSIS — I4891 Unspecified atrial fibrillation: Secondary | ICD-10-CM | POA: Insufficient documentation

## 2012-11-11 DIAGNOSIS — Z8739 Personal history of other diseases of the musculoskeletal system and connective tissue: Secondary | ICD-10-CM | POA: Insufficient documentation

## 2012-11-11 DIAGNOSIS — Z7982 Long term (current) use of aspirin: Secondary | ICD-10-CM | POA: Insufficient documentation

## 2012-11-11 DIAGNOSIS — Z86711 Personal history of pulmonary embolism: Secondary | ICD-10-CM | POA: Insufficient documentation

## 2012-11-11 LAB — CBC
MCHC: 32.7 g/dL (ref 30.0–36.0)
Platelets: 227 10*3/uL (ref 150–400)
RDW: 18 % — ABNORMAL HIGH (ref 11.5–15.5)

## 2012-11-11 LAB — COMPREHENSIVE METABOLIC PANEL
ALT: 13 U/L (ref 0–35)
Albumin: 3.3 g/dL — ABNORMAL LOW (ref 3.5–5.2)
Alkaline Phosphatase: 114 U/L (ref 39–117)
Potassium: 4.4 mEq/L (ref 3.5–5.1)
Sodium: 137 mEq/L (ref 135–145)
Total Protein: 6.9 g/dL (ref 6.0–8.3)

## 2012-11-11 LAB — POCT I-STAT TROPONIN I: Troponin i, poc: 0 ng/mL (ref 0.00–0.08)

## 2012-11-11 MED ORDER — AMLODIPINE BESYLATE 10 MG PO TABS: ORAL_TABLET | ORAL | Status: DC

## 2012-11-11 NOTE — ED Notes (Signed)
PT discharged to home with family

## 2012-11-11 NOTE — ED Notes (Signed)
Per pt, pt has been monitoring BP at home and it has been elevated at 190 and 200 systolic. Diastolic WNL. Pt reports having blurred vision and dizziness and near syncope earlier today. Pt denies numbness and tingling in extremities.

## 2012-11-11 NOTE — ED Provider Notes (Signed)
History     CSN: [id number]  Arrival date & time 11/11/12  0445   First MD Initiated Contact with Patient 11/11/12 [phone]      Chief Complaint  Patient presents with  . Hypertension    (Consider location/radiation/quality/duration/timing/severity/associated sxs/prior treatment) HPI  Patient is a 77 yo woman with multiple chronic medical problems including, but not limited to, CAD - s/p 1vCABG, s/p AVR, history of inferior MI, HTN, peripheral vascular disease, history of pulmonary embolus.   She presents via POV with conern for high blood pressure. The patient checked her BP yesterday evening around 1730 "because I just wasn't feeling right".  At that time it was 170 systolic. She checked it again around 2300 and it was 190 systolic, Checked a third time at 0230 and it was 200 and then and 0300 the systolic BP was 230 mmHG  Patient says she has never seen her BP that hight before. She denies any med changes or missed doses. Says she is not under emotional stress. No headache, chest pain or SOB. The patient is anticoagulated with Xarelto.   Patient's only sx throughout the day were a feel "of just floating" "just not feeling right - like you know something is wrong in your body" and feeling of facial numbness.. She felt yesterday morning "like my eyes wanted to cross". That was self limited.   Note, the patient is anticoagulated with several toe 4 history of pulmonary embolus  Past Medical History  Diagnosis Date  . CAD (coronary artery disease) July 2010    s/p CABG  . S/P AVR (aortic valve replacement) July 2010  . 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 PAF (paroxysmal atrial fibrillation)   . Iron deficiency anemia   . HTN (hypertension)   . Diabetes mellitus type 2, diet-controlled   . Hyperlipidemia   . DJD (degenerative joint disease)   . Carotid artery disease   . GERD (gastroesophageal reflux disease)   . Esophagitis     s/p esophageal  dilatation  . Pernicious anemia     on B12 injections  . Chronic anticoagulation   . Shoulder fracture, left September 2011  . Fracture     of left humerus   . Nonsustained ventricular tachycardia   . DJD (degenerative joint disease)   . Shortness of breath   . Orthopnea      She reports 2-3-pillow orthopnea  . Lower extremity edema     bilateral   . Dizziness     developed intermittent episodes of dizziness  . Chest tightness or pressure   . Aortic stenosis   . Osteopenia   . Iron deficiency anemia   . Vitamin B12 deficiency   . Dyslipidemia   . Hiatal hernia   . History of pulmonary embolus (PE) 15 years ago    Past Surgical History  Procedure Laterality Date  . Coronary artery bypass graft  12/2008    CABG x 1; Dr. [name]  . Aortic valve replacement  12/2008  . Cardiac catheterization  02/2010  . Aspiration thrombectomy  07/2009  . Other surgical history      Total hysterectomy  . Cholecystectomy      Family History  Problem Relation Age of Onset  . Stroke Mother   . Stroke Father   . Heart disease Brother   . Heart disease Brother     History  Substance Use Topics  . Smoking status: Never Smoker   .  Smokeless tobacco: Never Used  . Alcohol Use: No    OB History   Grav Para Term Preterm Abortions TAB SAB Ect Mult Living                  Review of Systems Gen: no weight loss, fevers, chills, night sweats Eyes: no discharge or drainage, no occular pain or visual changes Nose: no epistaxis or rhinorrhea Mouth: no dental pain, no sore throat Neck: no neck pain Lungs: no SOB, cough, wheezing CV: As per history of present illness, otherwise negative Abd: no abdominal pain, nausea, vomiting GU: no dysuria or gross hematuria MSK: no myalgias or arthralgias Neuro: no headache, no focal neurologic deficits Skin: no rash Psyche: negative.  Allergies  Lisinopril; Metformin; and Fluconazole  Home Medications   Current Outpatient Rx  Name  Route  Sig   Dispense  Refill  . ALPRAZolam (XANAX) 0.25 MG tablet   Oral   Take 0.25 mg by mouth 3 (three) times daily as needed. For anxiety         . amiodarone (PACERONE) 200 MG tablet   Oral   Take 1 tablet (200 mg total) by mouth daily.   30 tablet   3   . aspirin 81 MG tablet   Oral   Take 81 mg by mouth daily.           Marland Kitchen atorvastatin (LIPITOR) 40 MG tablet   Oral   Take 1 tablet (40 mg total) by mouth daily.   30 tablet   6   . Calcium Carbonate-Vit D-Min (CALCIUM 1200 PO)   Oral   Take by mouth daily.           . colchicine 0.6 MG tablet   Oral   Take 0.6 mg by mouth daily as needed (for gout flare ups, usually takes for 3 days).          . cyanocobalamin (,VITAMIN B-12,) 1000 MCG/ML injection   Intramuscular   Inject 1 mL (1,000 mcg total) into the muscle every 30 (thirty) days.   30 mL   1   . dextromethorphan (DELSYM) 30 MG/5ML liquid   Oral   Take 60 mg by mouth as needed.         . docusate sodium (COLACE) 100 MG capsule   Oral   Take 100 mg by mouth daily as needed for constipation. As needed   10 capsule   0   . famotidine (PEPCID) 20 MG tablet   Oral   Take 1 tablet (20 mg total) by mouth 2 (two) times daily.   60 tablet   12   . fluticasone (FLONASE) 50 MCG/ACT nasal spray   Nasal   Place 1 spray into the nose daily as needed for rhinitis.          . furosemide (LASIX) 20 MG tablet   Oral   Take 1 tablet (20 mg total) by mouth daily.   60 tablet   5     NEEDS REFILLS   . losartan (COZAAR) 100 MG tablet   Oral   Take 100 mg by mouth daily.         . raloxifene (EVISTA) 60 MG tablet   Oral   Take 60 mg by mouth daily.           . rivaroxaban (XARELTO) 15 MG TABS tablet   Oral   Take 1 tablet (15 mg total) by mouth daily with supper.   30 tablet  5     New dose   . meclizine (ANTIVERT) 12.5 MG tablet   Oral   Take 12.5 mg by mouth 3 (three) times daily as needed. For dizziness         . nitroGLYCERIN (NITROSTAT)  0.4 MG SL tablet   Sublingual   Place 1 tablet (0.4 mg total) under the tongue every 5 (five) minutes as needed.   25 tablet   prn     BP 212/78  Pulse 75  Temp(Src) 98 F (36.7 C) (Oral)  Resp 16  SpO2 100%  Physical Exam Gen: well developed and well nourished appearing Head: NCAT Eyes: PERL, EOMI Nose: no epistaixis or rhinorrhea Mouth/throat: mucosa is moist and pink Neck: supple, no stridor Lungs: CTA B, no wheezing, rhonchi or rales CV: RRR, 2/6 SEM, extremities appear well perfused.  Abd: soft, notender, nondistended Back: no ttp, no cva ttp Skin: no rashese, wnl Neuro: CN ii-xii grossly intact, no focal deficits, 5/5 motor strength both arms and legs. Normal speech, normal finger to nose, normal gait.  Psyche; normal affect,  calm and cooperative.   ED Course  Procedures (including critical care time)  Results for orders placed during the hospital encounter of 11/11/12 (from the past 24 hour(s))  CBC     Status: Abnormal   Collection Time    11/11/12  6:30 AM      Result Value Range   WBC 7.0  4.0 - 10.5 K/uL   RBC 4.45  3.87 - 5.11 MIL/uL   Hemoglobin 12.3  12.0 - 15.0 g/dL   HCT 84.1  32.4 - 40.1 %   MCV 84.5  78.0 - 100.0 fL   MCH 27.6  26.0 - 34.0 pg   MCHC 32.7  30.0 - 36.0 g/dL   RDW 02.7 (*) 25.3 - 66.4 %   Platelets 227  150 - 400 K/uL  COMPREHENSIVE METABOLIC PANEL     Status: Abnormal   Collection Time    11/11/12  6:30 AM      Result Value Range   Sodium 137  135 - 145 mEq/L   Potassium 4.4  3.5 - 5.1 mEq/L   Chloride 101  96 - 112 mEq/L   CO2 23  19 - 32 mEq/L   Glucose, Bld 115 (*) 70 - 99 mg/dL   BUN 24 (*) 6 - 23 mg/dL   Creatinine, Ser 4.03 (*) 0.50 - 1.10 mg/dL   Calcium 8.8  8.4 - 47.4 mg/dL   Total Protein 6.9  6.0 - 8.3 g/dL   Albumin 3.3 (*) 3.5 - 5.2 g/dL   AST 18  0 - 37 U/L   ALT 13  0 - 35 U/L   Alkaline Phosphatase 114  39 - 117 U/L   Total Bilirubin 0.7  0.3 - 1.2 mg/dL   GFR calc non Af Amer 38 (*) >90 mL/min   GFR  calc Af Amer 44 (*) >90 mL/min  PROTIME-INR     Status: Abnormal   Collection Time    11/11/12  6:30 AM      Result Value Range   Prothrombin Time 26.1 (*) 11.6 - 15.2 seconds   INR 2.54 (*) 0.00 - 1.49  POCT I-STAT TROPONIN I     Status: None   Collection Time    11/11/12  6:34 AM      Result Value Range   Troponin i, poc 0.00  0.00 - 0.08 ng/mL   Comment 3  EKG: nsr, no acute ischemic changes, normal intervals, normal axis, normal qrs complex  CXR: normal cardiac silloute, normal appearing mediastinum, no infiltrates, no acute process identified.   CT brain: no acute findings.    MDM  The patient's BP has improved to the 160s/upper 90s range without intervention since she has been on monitor in her room. She is asymptomatic at this time. I believe she is stable for discharge with plan to call her ph ysician's office first thing Monday for first available appt. She is on max dose of Losartan and not a good candidate for a BB in light of pulse persistently around 60 on Amiodarone.  Will add Norvasc 5mg  to current regimen with 1 week supply and counsel to discuss with PCP on Monday. Patient to check BPs in morning and evening. Counseled to return to the ED for red flag sx.          Brandt Loosen, MD 11/11/12 [phone]

## 2012-12-25 ENCOUNTER — Other Ambulatory Visit: Payer: Self-pay | Admitting: *Deleted

## 2012-12-25 ENCOUNTER — Other Ambulatory Visit: Payer: Self-pay | Admitting: Cardiology

## 2012-12-25 DIAGNOSIS — I4891 Unspecified atrial fibrillation: Secondary | ICD-10-CM

## 2012-12-25 MED ORDER — RIVAROXABAN 15 MG PO TABS: 15.0000 mg | ORAL_TABLET | Freq: Every day | ORAL | Status: DC

## 2013-01-01 ENCOUNTER — Telehealth: Payer: Self-pay | Admitting: Cardiology

## 2013-01-01 NOTE — Telephone Encounter (Signed)
New problem    Pt concerned about her b/p and wants to know if she needs to come in

## 2013-01-01 NOTE — Telephone Encounter (Signed)
Spoke with patient who c/o feeling weak, like she is going to "pass out," "unable to stand up long enough to do anything" and nausea.  Patient states she has been seeing her PCP, Dr. Nicholos Johns for BP medication titration since going to the ED on 5/31.  Patient states she has voiced her concerns about feeling so poorly to Dr. Nicholos Johns and he has made a few medication changes.  Patient would like Dr. Yevonne Pax opinion.  Patient is currently taking: Bystolic 10 mg QD Norvasc 5 mg QD Lasix 10 mg QD HCTZ 25 mg QD Amiodarone 200 mg QD Xarelto 15 mg QD Patient states she was unable to tolerate Metoprolol 100 mg and so this was recently d/c'ed.  I advised patient that Dr. Patty Sermons is not in the office today but that I will route message to him for advice.  Patient verbalized understanding and agreement with plan of care.

## 2013-01-02 NOTE — Telephone Encounter (Signed)
Discussed with  Dr. Patty Sermons and he would like to see patient soon. Scheduled ov for this week, information given to patient

## 2013-01-05 ENCOUNTER — Telehealth: Payer: Self-pay | Admitting: *Deleted

## 2013-01-05 ENCOUNTER — Encounter: Payer: Self-pay | Admitting: Cardiology

## 2013-01-05 ENCOUNTER — Ambulatory Visit (INDEPENDENT_AMBULATORY_CARE_PROVIDER_SITE_OTHER): Payer: Medicare Other | Admitting: Cardiology

## 2013-01-05 VITALS — BP 120/82 | HR 58 | Ht 65.0 in | Wt 218.8 lb

## 2013-01-05 DIAGNOSIS — Z79899 Other long term (current) drug therapy: Secondary | ICD-10-CM

## 2013-01-05 DIAGNOSIS — E876 Hypokalemia: Secondary | ICD-10-CM

## 2013-01-05 DIAGNOSIS — I480 Paroxysmal atrial fibrillation: Secondary | ICD-10-CM

## 2013-01-05 DIAGNOSIS — I498 Other specified cardiac arrhythmias: Secondary | ICD-10-CM

## 2013-01-05 DIAGNOSIS — I10 Essential (primary) hypertension: Secondary | ICD-10-CM

## 2013-01-05 DIAGNOSIS — R079 Chest pain, unspecified: Secondary | ICD-10-CM

## 2013-01-05 DIAGNOSIS — I4891 Unspecified atrial fibrillation: Secondary | ICD-10-CM

## 2013-01-05 DIAGNOSIS — E538 Deficiency of other specified B group vitamins: Secondary | ICD-10-CM

## 2013-01-05 DIAGNOSIS — R001 Bradycardia, unspecified: Secondary | ICD-10-CM

## 2013-01-05 LAB — CBC WITH DIFFERENTIAL/PLATELET
Basophils Relative: 0.4 % (ref 0.0–3.0)
Eosinophils Relative: 2 % (ref 0.0–5.0)
HCT: 39.4 % (ref 36.0–46.0)
Hemoglobin: 13.2 g/dL (ref 12.0–15.0)
Lymphs Abs: 1.3 10*3/uL (ref 0.7–4.0)
Monocytes Relative: 5.3 % (ref 3.0–12.0)
Neutro Abs: 6 10*3/uL (ref 1.4–7.7)
RBC: 4.55 Mil/uL (ref 3.87–5.11)
WBC: 8 10*3/uL (ref 4.5–10.5)

## 2013-01-05 LAB — BASIC METABOLIC PANEL
BUN: 31 mg/dL — ABNORMAL HIGH (ref 6–23)
Chloride: 102 mEq/L (ref 96–112)
Potassium: 2.6 mEq/L — CL (ref 3.5–5.1)
Sodium: 137 mEq/L (ref 135–145)

## 2013-01-05 LAB — TSH: TSH: 3.71 u[IU]/mL (ref 0.35–5.50)

## 2013-01-05 MED ORDER — POTASSIUM CHLORIDE CRYS ER 20 MEQ PO TBCR: EXTENDED_RELEASE_TABLET | ORAL | Status: DC

## 2013-01-05 MED ORDER — LOSARTAN POTASSIUM 25 MG PO TABS: 25.0000 mg | ORAL_TABLET | Freq: Every day | ORAL | Status: DC

## 2013-01-05 MED ORDER — CYANOCOBALAMIN 1000 MCG/ML IJ SOLN: 1000.0000 ug | INTRAMUSCULAR | Status: DC

## 2013-01-05 MED ORDER — NITROGLYCERIN 0.4 MG SL SUBL: 0.4000 mg | SUBLINGUAL_TABLET | SUBLINGUAL | Status: DC | PRN

## 2013-01-05 NOTE — Telephone Encounter (Signed)
Advised and scheduled follow up

## 2013-01-05 NOTE — Assessment & Plan Note (Signed)
The patient has remained in normal rhythm on amiodarone.  We checked her thyroid function studies today and her CBC and there is no evidence of hypothyroidism or anemia

## 2013-01-05 NOTE — Assessment & Plan Note (Addendum)
Blood pressure currently is normal.  Her potassium however is markedly decreased at 2.6 and we will add K. Dur 40 mg daily for 3 days and then 20 mg daily thereafter.  We are stopping HCTZ and we will go back to her previous dose of Lasix 20 mg daily for her diuretic.  We will also restart losartan low-dose of 25 mg one daily.  We will have her return for a basal metabolic panel in one week to check on potassium.

## 2013-01-05 NOTE — Assessment & Plan Note (Signed)
EKG today shows marked sinus bradycardia with PVCs.  We will stop her beta blocker to allow a faster heart rate

## 2013-01-05 NOTE — Patient Instructions (Addendum)
Will obtain labs today and call you with the results (BMET/TSH/FT4/CBC)  STOP BYSTOLIC  STOP HYDROCHLOROTHIAZIDE  START LOSARTAN 25 MG DAILY  INCREASE YOUR LASIX TO 20 MG DAILY  Keep your August appointment

## 2013-01-05 NOTE — Progress Notes (Signed)
Amber [name] Date of Birth:  Jan 24, 1936 [facility] [address] [place], [place]  [address] [phone]         Fax   [phone]  History of Present Illness: This pleasant 77 year old woman is seen for a four-month work in office visit. . She has a past history of ischemic heart disease and a past history of aortic stenosis. She underwent CABG and aortic valve replacement with a tissue aortic valve on 01/10/09. She also has a past history of paroxysmal atrial fibrillation and is maintaining normal sinus rhythm on amiodarone 200 mg She is on Xarelto 20 mg daily for anticoagulation. She was recently hospitalized for cough and hemoptysis. She was admitted on 05/19/12 and discharged on 05/20/12. While in the hospital she was noted to have marked sinus bradycardia and her low dose beta blocker was stopped and her amiodarone was continued. While on telemetry she did not have any evidence of recurrent atrial fib. The patient has a history of gout and is on medication to prevent acute gout flareup. The patient has been complaining of increased malaise and fatigue. She has a past history of iron deficiency anemia several years ago. Recently he patient was seen in the emergency room with systolic hypertension.  Subsequent to that she has had several visits with her PCP and her medications have been changed.  Her losartan was stopped.  Hydrochlorothiazide was added and her Lasix was reduced and she was given a brief trial of metoprolol which she did not tolerate and more recently was placed on bystolic.  She has not felt well.  He has felt extremely weak.  Her pulse today is a very slow.   Current Outpatient Prescriptions  Medication Sig Dispense Refill  . ALPRAZolam (XANAX) 0.25 MG tablet Take 0.25 mg by mouth 3 (three) times daily as needed. For anxiety      . amiodarone (PACERONE) 200 MG tablet Take 1 tablet (200 mg total) by mouth daily.  30 tablet  3  . amLODipine (NORVASC) 5 MG  tablet Take 5 mg by mouth daily.      Marland Kitchen aspirin 81 MG tablet Take 81 mg by mouth daily.        Marland Kitchen atorvastatin (LIPITOR) 40 MG tablet Take 1 tablet (40 mg total) by mouth daily.  30 tablet  6  . Calcium Carbonate-Vit D-Min (CALCIUM 1200 PO) Take by mouth daily.        . colchicine 0.6 MG tablet Take 0.6 mg by mouth daily as needed (for gout flare ups, usually takes for 3 days).       . cyanocobalamin (,VITAMIN B-12,) 1000 MCG/ML injection Inject 1 mL (1,000 mcg total) into the muscle every 30 (thirty) days.  30 mL  1  . dextromethorphan (DELSYM) 30 MG/5ML liquid Take 60 mg by mouth as needed.      . docusate sodium (COLACE) 100 MG capsule Take 100 mg by mouth daily as needed for constipation. As needed  10 capsule  0  . famotidine (PEPCID) 20 MG tablet Take 1 tablet (20 mg total) by mouth 2 (two) times daily.  60 tablet  12  . fluticasone (FLONASE) 50 MCG/ACT nasal spray Place 1 spray into the nose daily as needed for rhinitis.       . furosemide (LASIX) 20 MG tablet Take 20 mg by mouth daily.       . meclizine (ANTIVERT) 12.5 MG tablet Take 12.5 mg by mouth 3 (three) times daily as needed.  For dizziness      . nitroGLYCERIN (NITROSTAT) 0.4 MG SL tablet Place 1 tablet (0.4 mg total) under the tongue every 5 (five) minutes as needed.  25 tablet  prn  . raloxifene (EVISTA) 60 MG tablet Take 60 mg by mouth daily.        . Rivaroxaban (XARELTO) 15 MG TABS tablet Take 1 tablet (15 mg total) by mouth daily with supper.  30 tablet  5  . losartan (COZAAR) 25 MG tablet Take 1 tablet (25 mg total) by mouth daily.  90 tablet  3  . potassium chloride SA (K-DUR,KLOR-CON) 20 MEQ tablet 2 a day for 3 days and then 1 daily  35 tablet  5   No current facility-administered medications for this visit.    Allergies  Allergen Reactions  . Bystolic (Nebivolol Hcl)     Bradycardia.  . Lisinopril Cough  . Metformin Nausea Only    nausea  . Metoprolol     Bradycardia and weakness  . Fluconazole Rash     Patient Active Problem List   Diagnosis Date Noted  . PAF (paroxysmal atrial fibrillation)     Priority: Medium  . Malaise and fatigue 09/25/2012  . Cough 05/19/2012  . CKD (chronic kidney disease) stage 3, GFR 30-59 ml/min 05/19/2012  . Hemoptysis 05/19/2012  . Hypokalemia 05/19/2012  . Pulmonary nodule 05/19/2012  . Sinus bradycardia 05/19/2012  . Acute on chronic diastolic CHF (congestive heart failure), NYHA class 2 05/19/2012  . History of pulmonary embolus (PE)   . Iron deficiency anemia   . Vitamin B12 deficiency 10/11/2011  . Bronchitis 10/11/2011  . Hx of CABG 10/14/2010  . S/P aortic valve replacement with bioprosthetic valve 10/14/2010  . HTN (hypertension)   . Chronic anticoagulation   . CAD (coronary artery disease) 12/12/2008    History  Smoking status  . Never Smoker   Smokeless tobacco  . Never Used    History  Alcohol Use No    Family History  Problem Relation Age of Onset  . Stroke Mother   . Stroke Father   . Heart disease Brother   . Heart disease Brother     Review of Systems: Constitutional: no fever chills diaphoresis or fatigue or change in weight.  Head and neck: no hearing loss, no epistaxis, no photophobia or visual disturbance. Respiratory: No cough, shortness of breath or wheezing. Cardiovascular: No chest pain peripheral edema, palpitations. Gastrointestinal: No abdominal distention, no abdominal pain, no change in bowel habits hematochezia or melena. Genitourinary: No dysuria, no frequency, no urgency, no nocturia. Musculoskeletal:No arthralgias, no back pain, no gait disturbance or myalgias. Neurological: No dizziness, no headaches, no numbness, no seizures, no syncope, no weakness, no tremors. Hematologic: No lymphadenopathy, no easy bruising. Psychiatric: No confusion, no hallucinations, no sleep disturbance.    Physical Exam: Filed Vitals:   01/05/13 1150  BP: 120/82  Pulse: 58   the general appearance reveals a  middle-aged woman who is weak.  She came in by wheelchair.The head and neck exam reveals pupils equal and reactive.  Extraocular movements are full.  There is no scleral icterus.  The mouth and pharynx are normal.  The neck is supple.  The carotids reveal no bruits.  The jugular venous pressure is normal.  The  thyroid is not enlarged.  There is no lymphadenopathy.  The chest is clear to percussion and auscultation.  There are no rales or rhonchi.  Expansion of the chest is symmetrical.  The precordium is  quiet.  The first heart sound is normal.  The second heart sound is physiologically split.  There is no murmur gallop rub or click.  The prosthetic aortic valve is functioning well. There is no abnormal lift or heave.  The abdomen is soft and nontender.  The bowel sounds are normal.  The liver and spleen are not enlarged.  There are no abdominal masses.  There are no abdominal bruits.  Extremities reveal good pedal pulses.  There is no phlebitis or edema.  There is no cyanosis or clubbing.  Strength is normal and symmetrical in all extremities.  There is no lateralizing weakness.  There are no sensory deficits.  The skin is warm and dry.  There is no rash.  EKG shows marked sinus bradycardia with frequent PVCs and there is prolonged QT interval consistent with her hyperkalemia.  Assessment / Plan: We have stopped her beta blockers.  She does not tolerate beta blockers now. We will check lab work next week and she will keep her regular appointment on August 13 for office visit and EKG

## 2013-01-05 NOTE — Telephone Encounter (Signed)
Message copied by Burnell Blanks on Fri Jan 05, 2013  5:24 PM ------      Message from: Cassell Clement      Created: Fri Jan 05, 2013  5:11 PM       Please report.  The thyroid level is normal.  She is not anemic.  The potassium is very low 2.6.  Start K. Dur 20 mEq and take 2 daily for 3 days and then one daily thereafter.  Recheck a basal metabolic panel in one week ------

## 2013-01-11 ENCOUNTER — Other Ambulatory Visit (INDEPENDENT_AMBULATORY_CARE_PROVIDER_SITE_OTHER): Payer: Medicare Other

## 2013-01-11 ENCOUNTER — Telehealth: Payer: Self-pay | Admitting: *Deleted

## 2013-01-11 DIAGNOSIS — E876 Hypokalemia: Secondary | ICD-10-CM

## 2013-01-11 LAB — BASIC METABOLIC PANEL
BUN: 20 mg/dL (ref 6–23)
CO2: 21 mEq/L (ref 19–32)
Chloride: 111 mEq/L (ref 96–112)
Creatinine, Ser: 1.4 mg/dL — ABNORMAL HIGH (ref 0.4–1.2)
Potassium: 4.1 mEq/L (ref 3.5–5.1)

## 2013-01-11 NOTE — Progress Notes (Signed)
Quick Note:  Please report to patient. The recent labs are stable. Continue same medication and careful diet. Potassium is normal now. CSD ______

## 2013-01-11 NOTE — Telephone Encounter (Signed)
Advised patient, call back if no better

## 2013-01-11 NOTE — Telephone Encounter (Signed)
Feet are swelling and weight up 7 pounds since Friday. Increased shortness of breath. Taking Lasix 20 mg daily. Will forward to  Dr. Patty Sermons for review   Advised patient of lab results

## 2013-01-11 NOTE — Telephone Encounter (Signed)
Message copied by Burnell Blanks on Thu Jan 11, 2013  3:30 PM ------      Message from: Cassell Clement      Created: Thu Jan 11, 2013  2:10 PM       Please report to patient.  The recent labs are stable. Continue same medication and careful diet. Potassium is normal now. CSD ------

## 2013-01-11 NOTE — Telephone Encounter (Signed)
Increase lasix to 40 mg daily and increase potassium to one BID

## 2013-01-15 ENCOUNTER — Other Ambulatory Visit: Payer: Self-pay | Admitting: Cardiology

## 2013-01-21 ENCOUNTER — Other Ambulatory Visit: Payer: Self-pay | Admitting: Cardiology

## 2013-01-24 ENCOUNTER — Ambulatory Visit (INDEPENDENT_AMBULATORY_CARE_PROVIDER_SITE_OTHER): Payer: Medicare Other | Admitting: Cardiology

## 2013-01-24 ENCOUNTER — Encounter: Payer: Self-pay | Admitting: Cardiology

## 2013-01-24 VITALS — BP 136/82 | HR 61 | Ht 65.0 in | Wt 215.8 lb

## 2013-01-24 DIAGNOSIS — Z952 Presence of prosthetic heart valve: Secondary | ICD-10-CM

## 2013-01-24 DIAGNOSIS — Z953 Presence of xenogenic heart valve: Secondary | ICD-10-CM

## 2013-01-24 DIAGNOSIS — R5383 Other fatigue: Secondary | ICD-10-CM

## 2013-01-24 DIAGNOSIS — I480 Paroxysmal atrial fibrillation: Secondary | ICD-10-CM

## 2013-01-24 DIAGNOSIS — R5381 Other malaise: Secondary | ICD-10-CM

## 2013-01-24 DIAGNOSIS — I4891 Unspecified atrial fibrillation: Secondary | ICD-10-CM

## 2013-01-24 LAB — BASIC METABOLIC PANEL
BUN: 21 mg/dL (ref 6–23)
CO2: 26 mEq/L (ref 19–32)
Calcium: 9.3 mg/dL (ref 8.4–10.5)
GFR: 34.93 mL/min — ABNORMAL LOW (ref >60.00)
Glucose, Bld: 114 mg/dL — ABNORMAL HIGH (ref 70–99)

## 2013-01-24 NOTE — Assessment & Plan Note (Signed)
Rhythm remains normal sinus rhythm on low dose amiodarone and off beta blocker.

## 2013-01-24 NOTE — Assessment & Plan Note (Signed)
The patient is not having symptoms of CHF

## 2013-01-24 NOTE — Assessment & Plan Note (Signed)
Her symptoms of malaise and fatigue have improved on potassium repletion.  We're checking a basal metabolic panel today

## 2013-01-24 NOTE — Patient Instructions (Signed)
Will obtain labs today and call you with the results (bmet)  Your physician recommends that you continue on your current medications as directed. Please refer to the Current Medication list given to you today.  Your physician recommends that you schedule a follow-up appointment in: 3 month ov/ekg/bmet 

## 2013-01-24 NOTE — Progress Notes (Signed)
Quick Note:  Please report to patient. The recent labs are stable. Continue same medication and careful diet.K+4.1 good ______

## 2013-01-24 NOTE — Progress Notes (Signed)
Amber [name] Date of Birth:  July 24, 1935 [facility] [address] [place], [place]  [address] [phone]         Fax   [phone]  History of Present Illness: This pleasant 77 year old woman is seen for a followup office visit. . She has a past history of ischemic heart disease and a past history of aortic stenosis. She underwent CABG and aortic valve replacement with a tissue aortic valve on 01/10/09. She also has a past history of paroxysmal atrial fibrillation and is maintaining normal sinus rhythm on amiodarone 200 mg She is on Xarelto 20 mg daily for anticoagulation. She was recently hospitalized for cough and hemoptysis. She was admitted on 05/19/12 and discharged on 05/20/12. While in the hospital she was noted to have marked sinus bradycardia and her low dose beta blocker was stopped and her amiodarone was continued. While on telemetry she did not have any evidence of recurrent atrial fib. The patient has a history of gout and is on medication to prevent acute gout flareup. The patient has been complaining of increased malaise and fatigue. She has a past history of iron deficiency anemia several years ago.  She was recently seen on 01/05/13 at which time she was noted to have marked sinus bradycardia wall on beta blocker.  Her beta blocker was stopped and she returns today improved.  She has remained in normal sinus rhythm on amiodarone.  At her last visit she was hypokalemic with potassium of 2.6 contributing to her marked weakness.  Her HCTZ was stopped and she is back on Lasix 20 mg daily and on losartan 25 mg daily and is on supplemental potassium.   Current Outpatient Prescriptions  Medication Sig Dispense Refill  . ALPRAZolam (XANAX) 0.25 MG tablet Take 0.25 mg by mouth 3 (three) times daily as needed. For anxiety      . amiodarone (PACERONE) 200 MG tablet TAKE 1 TABLET (200 MG TOTAL) BY MOUTH DAILY.  30 tablet  3  . amLODipine (NORVASC) 5 MG tablet Take 5 mg by  mouth daily.      Marland Kitchen aspirin 81 MG tablet Take 81 mg by mouth daily.        Marland Kitchen atorvastatin (LIPITOR) 40 MG tablet TAKE 1 TABLET (40 MG TOTAL) BY MOUTH DAILY.  30 tablet  6  . Calcium Carbonate-Vit D-Min (CALCIUM 1200 PO) Take by mouth daily.        . colchicine 0.6 MG tablet Take 0.6 mg by mouth daily as needed (for gout flare ups, usually takes for 3 days).       . cyanocobalamin (,VITAMIN B-12,) 1000 MCG/ML injection Inject 1 mL (1,000 mcg total) into the muscle every 30 (thirty) days.  30 mL  1  . dextromethorphan (DELSYM) 30 MG/5ML liquid Take 60 mg by mouth as needed.      . docusate sodium (COLACE) 100 MG capsule Take 100 mg by mouth daily as needed for constipation. As needed  10 capsule  0  . famotidine (PEPCID) 20 MG tablet Take 1 tablet (20 mg total) by mouth 2 (two) times daily.  60 tablet  12  . fluticasone (FLONASE) 50 MCG/ACT nasal spray Place 1 spray into the nose daily as needed for rhinitis.       . furosemide (LASIX) 20 MG tablet Take 20 mg by mouth as directed. 2 daily      . losartan (COZAAR) 25 MG tablet Take 1 tablet (25 mg total) by mouth daily.  90  tablet  3  . meclizine (ANTIVERT) 12.5 MG tablet Take 12.5 mg by mouth 3 (three) times daily as needed. For dizziness      . nitroGLYCERIN (NITROSTAT) 0.4 MG SL tablet Place 1 tablet (0.4 mg total) under the tongue every 5 (five) minutes as needed.  25 tablet  prn  . potassium chloride SA (K-DUR,KLOR-CON) 20 MEQ tablet Take 20 mEq by mouth 2 (two) times daily.      . raloxifene (EVISTA) 60 MG tablet Take 60 mg by mouth daily.        . Rivaroxaban (XARELTO) 15 MG TABS tablet Take 1 tablet (15 mg total) by mouth daily with supper.  30 tablet  5   No current facility-administered medications for this visit.    Allergies  Allergen Reactions  . Bystolic [Nebivolol Hcl]     Bradycardia.  . Lisinopril Cough  . Metformin Nausea Only    nausea  . Metoprolol     Bradycardia and weakness  . Fluconazole Rash    Patient Active  Problem List   Diagnosis Date Noted  . PAF (paroxysmal atrial fibrillation)     Priority: Medium  . Malaise and fatigue 09/25/2012  . Cough 05/19/2012  . CKD (chronic kidney disease) stage 3, GFR 30-59 ml/min 05/19/2012  . Hemoptysis 05/19/2012  . Hypokalemia 05/19/2012  . Pulmonary nodule 05/19/2012  . Sinus bradycardia 05/19/2012  . Acute on chronic diastolic CHF (congestive heart failure), NYHA class 2 05/19/2012  . History of pulmonary embolus (PE)   . Iron deficiency anemia   . Vitamin B12 deficiency 10/11/2011  . Bronchitis 10/11/2011  . Hx of CABG 10/14/2010  . S/P aortic valve replacement with bioprosthetic valve 10/14/2010  . HTN (hypertension)   . Chronic anticoagulation   . CAD (coronary artery disease) 12/12/2008    History  Smoking status  . Never Smoker   Smokeless tobacco  . Never Used    History  Alcohol Use No    Family History  Problem Relation Age of Onset  . Stroke Mother   . Stroke Father   . Heart disease Brother   . Heart disease Brother     Review of Systems: Constitutional: no fever chills diaphoresis or fatigue or change in weight.  Head and neck: no hearing loss, no epistaxis, no photophobia or visual disturbance. Respiratory: No cough, shortness of breath or wheezing. Cardiovascular: No chest pain peripheral edema, palpitations. Gastrointestinal: No abdominal distention, no abdominal pain, no change in bowel habits hematochezia or melena. Genitourinary: No dysuria, no frequency, no urgency, no nocturia. Musculoskeletal:No arthralgias, no back pain, no gait disturbance or myalgias. Neurological: No dizziness, no headaches, no numbness, no seizures, no syncope, no weakness, no tremors. Hematologic: No lymphadenopathy, no easy bruising. Psychiatric: No confusion, no hallucinations, no sleep disturbance.    Physical Exam: Filed Vitals:   01/24/13 1015  BP: 136/82  Pulse: 61   the general appearance reveals a well-developed  well-nourished elderly woman in no distress.The head and neck exam reveals pupils equal and reactive.  Extraocular movements are full.  There is no scleral icterus.  The mouth and pharynx are normal.  The neck is supple.  The carotids reveal no bruits.  The jugular venous pressure is normal.  The  thyroid is not enlarged.  There is no lymphadenopathy.  The chest is clear to percussion and auscultation.  There are no rales or rhonchi.  Expansion of the chest is symmetrical.  The precordium is quiet.  The first heart sound  is normal.  The second heart sound is physiologically split.  There is faint systolic murmur across her prosthetic aortic valve.  No diastolic murmur..  There is no abnormal lift or heave.  The abdomen is soft and nontender.  The bowel sounds are normal.  The liver and spleen are not enlarged.  There are no abdominal masses.  There are no abdominal bruits.  Extremities reveal good pedal pulses.  There is no phlebitis or edema.  There is no cyanosis or clubbing.  Strength is normal and symmetrical in all extremities.  There is no lateralizing weakness.  There are no sensory deficits.  The skin is warm and dry.  There is no rash.  EKG shows normal sinus rhythm at 61 per minute and QTc interval is now back to normal  Assessment / Plan: Continue same medication.  Await today's labs.  Recheck 3 months for office visit EKG and basal metabolic panel.

## 2013-01-25 ENCOUNTER — Other Ambulatory Visit: Payer: Self-pay | Admitting: Family Medicine

## 2013-01-25 DIAGNOSIS — M810 Age-related osteoporosis without current pathological fracture: Secondary | ICD-10-CM

## 2013-01-26 ENCOUNTER — Telehealth: Payer: Self-pay | Admitting: *Deleted

## 2013-01-26 NOTE — Telephone Encounter (Signed)
Advised patient of lab results  

## 2013-01-26 NOTE — Telephone Encounter (Signed)
Message copied by Burnell Blanks on Fri Jan 26, 2013  1:01 PM ------      Message from: Cassell Clement      Created: Wed Jan 24, 2013  9:26 PM       Please report to patient.  The recent labs are stable. Continue same medication and careful diet.K+4.1 good ------

## 2013-02-13 ENCOUNTER — Other Ambulatory Visit: Payer: Medicare Other

## 2013-02-27 ENCOUNTER — Ambulatory Visit
Admission: RE | Admit: 2013-02-27 | Discharge: 2013-02-27 | Disposition: A | Discharge status: Home / Self Care | Payer: Medicare Other | Source: Ambulatory Visit | Attending: Family Medicine | Admitting: Family Medicine

## 2013-02-27 DIAGNOSIS — M810 Age-related osteoporosis without current pathological fracture: Secondary | ICD-10-CM

## 2013-04-04 ENCOUNTER — Ambulatory Visit
Admission: RE | Admit: 2013-04-04 | Discharge: 2013-04-04 | Disposition: A | Discharge status: Home / Self Care | Payer: Medicare Other | Source: Ambulatory Visit | Attending: Family Medicine | Admitting: Family Medicine

## 2013-04-04 ENCOUNTER — Other Ambulatory Visit: Payer: Self-pay | Admitting: Family Medicine

## 2013-04-04 DIAGNOSIS — R109 Unspecified abdominal pain: Secondary | ICD-10-CM

## 2013-04-04 DIAGNOSIS — Z1231 Encounter for screening mammogram for malignant neoplasm of breast: Secondary | ICD-10-CM

## 2013-04-24 ENCOUNTER — Ambulatory Visit: Payer: Medicare Other

## 2013-04-30 ENCOUNTER — Ambulatory Visit (INDEPENDENT_AMBULATORY_CARE_PROVIDER_SITE_OTHER): Payer: Medicare Other | Admitting: Cardiology

## 2013-04-30 ENCOUNTER — Other Ambulatory Visit: Payer: Self-pay | Admitting: *Deleted

## 2013-04-30 ENCOUNTER — Encounter: Payer: Self-pay | Admitting: Cardiology

## 2013-04-30 ENCOUNTER — Telehealth: Payer: Self-pay | Admitting: *Deleted

## 2013-04-30 VITALS — BP 144/80 | HR 68 | Ht 65.0 in | Wt 220.0 lb

## 2013-04-30 DIAGNOSIS — Z951 Presence of aortocoronary bypass graft: Secondary | ICD-10-CM

## 2013-04-30 DIAGNOSIS — Z79899 Other long term (current) drug therapy: Secondary | ICD-10-CM

## 2013-04-30 DIAGNOSIS — R609 Edema, unspecified: Secondary | ICD-10-CM

## 2013-04-30 DIAGNOSIS — I480 Paroxysmal atrial fibrillation: Secondary | ICD-10-CM

## 2013-04-30 DIAGNOSIS — Z953 Presence of xenogenic heart valve: Secondary | ICD-10-CM

## 2013-04-30 DIAGNOSIS — I10 Essential (primary) hypertension: Secondary | ICD-10-CM

## 2013-04-30 DIAGNOSIS — Z952 Presence of prosthetic heart valve: Secondary | ICD-10-CM

## 2013-04-30 DIAGNOSIS — E538 Deficiency of other specified B group vitamins: Secondary | ICD-10-CM

## 2013-04-30 DIAGNOSIS — I4891 Unspecified atrial fibrillation: Secondary | ICD-10-CM

## 2013-04-30 LAB — BASIC METABOLIC PANEL
BUN: 19 mg/dL (ref 6–23)
CO2: 26 mEq/L (ref 19–32)
Calcium: 9.2 mg/dL (ref 8.4–10.5)
Creatinine, Ser: 1.4 mg/dL — ABNORMAL HIGH (ref 0.4–1.2)

## 2013-04-30 NOTE — Telephone Encounter (Signed)
Orders placed.

## 2013-04-30 NOTE — Progress Notes (Signed)
[name] Date of Birth:  1936-02-20 [address] [place], [place]  [address] [phone]         Fax   [phone]  History of Present Illness: This pleasant 77 year old woman is seen for a followup office visit. . She has a past history of ischemic heart disease and a past history of aortic stenosis. She underwent CABG and aortic valve replacement with a tissue aortic valve on 01/10/09. She also has a past history of paroxysmal atrial fibrillation and is maintaining normal sinus rhythm on amiodarone 200 mg She is on Xarelto 20 mg daily for anticoagulation. She was recently hospitalized for cough and hemoptysis. She was admitted on 05/19/12 and discharged on 05/20/12. While in the hospital she was noted to have marked sinus bradycardia and her low dose beta blocker was stopped and her amiodarone was continued. While on telemetry she did not have any evidence of recurrent atrial fib. The patient has a history of gout and is on medication to prevent acute gout flareup. The patient has been complaining of increased malaise and fatigue. She has a past history of iron deficiency anemia several years ago.  She was recently seen on 01/05/13 at which time she was noted to have marked sinus bradycardia wall on beta blocker.  Her beta blocker was stopped and she returns today improved.  She has remained in normal sinus rhythm on amiodarone.  At her last visit she was hypokalemic with potassium of 2.6 contributing to her marked weakness.  Her HCTZ was stopped and she is back on Lasix 20 mg daily and on losartan 25 mg daily and is on supplemental potassium.   Current Outpatient Prescriptions  Medication Sig Dispense Refill  . ALPRAZolam (XANAX) 0.25 MG tablet Take 0.25 mg by mouth 3 (three) times daily as needed. For anxiety      . amiodarone (PACERONE) 200 MG tablet TAKE 1 TABLET (200 MG TOTAL) BY MOUTH DAILY.  30 tablet  3  . amLODipine (NORVASC) 5 MG tablet Take 5 mg by mouth daily.        Marland Kitchen aspirin 81 MG tablet Take 81 mg by mouth daily.        Marland Kitchen atorvastatin (LIPITOR) 40 MG tablet TAKE 1 TABLET (40 MG TOTAL) BY MOUTH DAILY.  30 tablet  6  . Calcium Carbonate-Vit D-Min (CALCIUM 1200 PO) Take by mouth daily.        . clotrimazole-betamethasone (LOTRISONE) cream Apply topically as needed.      . colchicine 0.6 MG tablet Take 0.6 mg by mouth daily as needed (for gout flare ups, usually takes for 3 days).       . cyanocobalamin (,VITAMIN B-12,) 1000 MCG/ML injection Inject 1 mL (1,000 mcg total) into the muscle every 30 (thirty) days.  30 mL  1  . dextromethorphan (DELSYM) 30 MG/5ML liquid Take 60 mg by mouth as needed.      . docusate sodium (COLACE) 100 MG capsule Take 100 mg by mouth daily as needed for constipation. As needed  10 capsule  0  . famotidine (PEPCID) 20 MG tablet Take 1 tablet (20 mg total) by mouth 2 (two) times daily.  60 tablet  12  . fluticasone (FLONASE) 50 MCG/ACT nasal spray Place 1 spray into the nose daily as needed for rhinitis.       . furosemide (LASIX) 20 MG tablet Take 20 mg by mouth as directed. 2 daily      . losartan (COZAAR) 25  MG tablet Take 1/2 tablet by mouth daily      . meclizine (ANTIVERT) 12.5 MG tablet Take 12.5 mg by mouth 3 (three) times daily as needed. For dizziness      . nitroGLYCERIN (NITROSTAT) 0.4 MG SL tablet Place 1 tablet (0.4 mg total) under the tongue every 5 (five) minutes as needed.  25 tablet  prn  . potassium chloride SA (K-DUR,KLOR-CON) 20 MEQ tablet Take 20 mEq by mouth 2 (two) times daily.      . raloxifene (EVISTA) 60 MG tablet Take 60 mg by mouth daily.        . Rivaroxaban (XARELTO) 15 MG TABS tablet Take 1 tablet (15 mg total) by mouth daily with supper.  30 tablet  5   No current facility-administered medications for this visit.    Allergies  Allergen Reactions  . Bystolic [Nebivolol Hcl]     Bradycardia.  . Lisinopril Cough  . Metformin Nausea Only    nausea  . Metoprolol     Bradycardia and weakness  .  Fluconazole Rash    Patient Active Problem List   Diagnosis Date Noted  . PAF (paroxysmal atrial fibrillation)     Priority: Medium  . Malaise and fatigue 09/25/2012  . Cough 05/19/2012  . CKD (chronic kidney disease) stage 3, GFR 30-59 ml/min 05/19/2012  . Hemoptysis 05/19/2012  . Hypokalemia 05/19/2012  . Pulmonary nodule 05/19/2012  . Sinus bradycardia 05/19/2012  . Acute on chronic diastolic CHF (congestive heart failure), NYHA class 2 05/19/2012  . History of pulmonary embolus (PE)   . Iron deficiency anemia   . Vitamin B12 deficiency 10/11/2011  . Bronchitis 10/11/2011  . Hx of CABG 10/14/2010  . S/P aortic valve replacement with bioprosthetic valve 10/14/2010  . HTN (hypertension)   . Chronic anticoagulation   . CAD (coronary artery disease) 12/12/2008    History  Smoking status  . Never Smoker   Smokeless tobacco  . Never Used    History  Alcohol Use No    Family History  Problem Relation Age of Onset  . Stroke Mother   . Stroke Father   . Heart disease Brother   . Heart disease Brother     Review of Systems: Constitutional: no fever chills diaphoresis or fatigue or change in weight.  Head and neck: no hearing loss, no epistaxis, no photophobia or visual disturbance. Respiratory: No cough, shortness of breath or wheezing. Cardiovascular: No chest pain peripheral edema, palpitations. Gastrointestinal: No abdominal distention, no abdominal pain, no change in bowel habits hematochezia or melena. Genitourinary: No dysuria, no frequency, no urgency, no nocturia. Musculoskeletal:No arthralgias, no back pain, no gait disturbance or myalgias. Neurological: No dizziness, no headaches, no numbness, no seizures, no syncope, no weakness, no tremors. Hematologic: No lymphadenopathy, no easy bruising. Psychiatric: No confusion, no hallucinations, no sleep disturbance.    Physical Exam: Filed Vitals:   04/30/13 1047  BP: 144/80  Pulse: 68   the general  appearance reveals a well-developed well-nourished elderly woman in no distress.The head and neck exam reveals pupils equal and reactive.  Extraocular movements are full.  There is no scleral icterus.  The mouth and pharynx are normal.  The neck is supple.  The carotids reveal no bruits.  The jugular venous pressure is normal.  The  thyroid is not enlarged.  There is no lymphadenopathy.  The chest is clear to percussion and auscultation.  There are no rales or rhonchi.  Expansion of the chest is symmetrical.  The precordium is quiet.  The first heart sound is normal.  The second heart sound is physiologically split.  There is faint systolic murmur across her prosthetic aortic valve.  No diastolic murmur..  There is no abnormal lift or heave.  The abdomen is soft and nontender.  The bowel sounds are normal.  The liver and spleen are not enlarged.  There are no abdominal masses.  There are no abdominal bruits.  Extremities reveal good pedal pulses.  There is no phlebitis or edema.  There is no cyanosis or clubbing.  Strength is normal and symmetrical in all extremities.  There is no lateralizing weakness.  There are no sensory deficits.  The skin is warm and dry.  There is no rash.  EKG today shows normal sinus rhythm with nonspecific T-wave flattening.  QTC interval his 480 ms  Assessment / Plan: Continue same medication.  Await today's labs.  Recheck 3 months for office visit

## 2013-04-30 NOTE — Assessment & Plan Note (Signed)
She has developed a scratchy throat and a dry cough similar to what she had with lisinopril.  She has been on losartan 25 mg daily.  We will reduce her dose to just 12.5 mg daily and see if symptoms resolve.  If they do not resolve she may need to stop it altogether.

## 2013-04-30 NOTE — Patient Instructions (Signed)
Your physician has recommended you make the following change in your medication:  1) Decrease losartan to 25 mg 1/2 tablet by mouth daily  Your physician recommends that you have lab work today: [facility]  Your physician recommends that you schedule a follow-up appointment in: 3 months with Dr. Patty Sermons

## 2013-04-30 NOTE — Telephone Encounter (Signed)
Message copied by Antony Odea on Mon Apr 30, 2013  5:29 PM ------      Message from: Cassell Clement      Created: Mon Apr 30, 2013  3:56 PM       Please report. BMET is stable. When she returns for her next OV please schedule a CMET CBC and TSH (on amiodarone). ------

## 2013-04-30 NOTE — Assessment & Plan Note (Signed)
The patient is requesting not to have to take monthly B12 shots.  She will try taking an oral B12 tablet daily.  In 3-6 months she would need to have a B12 level checked to be sure that she is staying in the therapeutic range.

## 2013-04-30 NOTE — Assessment & Plan Note (Signed)
The patient has had no recurrence of her paroxysmal atrial fibrillation since last visit.

## 2013-05-11 ENCOUNTER — Ambulatory Visit: Payer: Medicare Other

## 2013-05-21 ENCOUNTER — Other Ambulatory Visit: Payer: Self-pay

## 2013-05-21 MED ORDER — FAMOTIDINE 20 MG PO TABS: 20.0000 mg | ORAL_TABLET | Freq: Two times a day (BID) | ORAL | Status: DC

## 2013-05-23 ENCOUNTER — Other Ambulatory Visit: Payer: Self-pay | Admitting: Cardiology

## 2013-06-18 ENCOUNTER — Ambulatory Visit
Admission: RE | Admit: 2013-06-18 | Discharge: 2013-06-18 | Disposition: A | Discharge status: Home / Self Care | Payer: Medicare Other | Source: Ambulatory Visit | Attending: Family Medicine | Admitting: Family Medicine

## 2013-06-18 DIAGNOSIS — Z1231 Encounter for screening mammogram for malignant neoplasm of breast: Secondary | ICD-10-CM

## 2013-06-19 ENCOUNTER — Other Ambulatory Visit: Payer: Self-pay | Admitting: Family Medicine

## 2013-06-19 DIAGNOSIS — N63 Unspecified lump in unspecified breast: Secondary | ICD-10-CM

## 2013-06-26 ENCOUNTER — Ambulatory Visit
Admission: RE | Admit: 2013-06-26 | Discharge: 2013-06-26 | Disposition: A | Discharge status: Home / Self Care | Payer: Medicare Other | Source: Ambulatory Visit | Attending: Family Medicine | Admitting: Family Medicine

## 2013-06-26 DIAGNOSIS — N63 Unspecified lump in unspecified breast: Secondary | ICD-10-CM

## 2013-07-07 ENCOUNTER — Other Ambulatory Visit: Payer: Self-pay | Admitting: Cardiology

## 2013-07-19 ENCOUNTER — Other Ambulatory Visit: Payer: Self-pay | Admitting: Cardiology

## 2013-08-02 ENCOUNTER — Encounter: Payer: Self-pay | Admitting: Cardiology

## 2013-08-02 ENCOUNTER — Other Ambulatory Visit: Payer: Medicare Other

## 2013-08-02 ENCOUNTER — Ambulatory Visit (INDEPENDENT_AMBULATORY_CARE_PROVIDER_SITE_OTHER): Payer: Medicare Other | Admitting: Cardiology

## 2013-08-02 VITALS — BP 140/78 | HR 62 | Ht 65.0 in | Wt 213.0 lb

## 2013-08-02 DIAGNOSIS — I4891 Unspecified atrial fibrillation: Secondary | ICD-10-CM

## 2013-08-02 DIAGNOSIS — I480 Paroxysmal atrial fibrillation: Secondary | ICD-10-CM

## 2013-08-02 DIAGNOSIS — R05 Cough: Secondary | ICD-10-CM

## 2013-08-02 DIAGNOSIS — Z952 Presence of prosthetic heart valve: Secondary | ICD-10-CM

## 2013-08-02 DIAGNOSIS — R059 Cough, unspecified: Secondary | ICD-10-CM

## 2013-08-02 DIAGNOSIS — Z953 Presence of xenogenic heart valve: Secondary | ICD-10-CM

## 2013-08-02 DIAGNOSIS — E7800 Pure hypercholesterolemia, unspecified: Secondary | ICD-10-CM

## 2013-08-02 DIAGNOSIS — Z79899 Other long term (current) drug therapy: Secondary | ICD-10-CM

## 2013-08-02 LAB — COMPREHENSIVE METABOLIC PANEL
ALT: 15 U/L (ref 0–35)
AST: 20 U/L (ref 0–37)
Albumin: 3.6 g/dL (ref 3.5–5.2)
Alkaline Phosphatase: 126 U/L — ABNORMAL HIGH (ref 39–117)
BILIRUBIN TOTAL: 0.9 mg/dL (ref 0.3–1.2)
BUN: 19 mg/dL (ref 6–23)
CO2: 24 mEq/L (ref 19–32)
CREATININE: 1.4 mg/dL — AB (ref 0.4–1.2)
Calcium: 9.5 mg/dL (ref 8.4–10.5)
Chloride: 105 mEq/L (ref 96–112)
GFR: 39.26 mL/min — AB (ref >60.00)
GLUCOSE: 102 mg/dL — AB (ref 70–99)
Potassium: 4.6 mEq/L (ref 3.5–5.1)
Sodium: 138 mEq/L (ref 135–145)
Total Protein: 6.9 g/dL (ref 6.0–8.3)

## 2013-08-02 LAB — LIPID PANEL
CHOLESTEROL: 171 mg/dL (ref 0–200)
HDL: 63.6 mg/dL (ref >39.00)
LDL Cholesterol: 84 mg/dL (ref 0–99)
TRIGLYCERIDES: 116 mg/dL (ref 0.0–149.0)
Total CHOL/HDL Ratio: 3
VLDL: 23.2 mg/dL (ref 0.0–40.0)

## 2013-08-02 LAB — CBC
HEMATOCRIT: 38.6 % (ref 36.0–46.0)
HEMOGLOBIN: 12.4 g/dL (ref 12.0–15.0)
MCHC: 32.3 g/dL (ref 30.0–36.0)
MCV: 87.2 fl (ref 78.0–100.0)
Platelets: 287 10*3/uL (ref 150.0–400.0)
RBC: 4.42 Mil/uL (ref 3.87–5.11)
RDW: 14.2 % (ref 11.5–14.6)
WBC: 6.8 10*3/uL (ref 4.5–10.5)

## 2013-08-02 LAB — TSH: TSH: 4.46 u[IU]/mL (ref 0.35–5.50)

## 2013-08-02 MED ORDER — AMLODIPINE BESYLATE 10 MG PO TABS: 10.0000 mg | ORAL_TABLET | Freq: Every day | ORAL | Status: AC

## 2013-08-02 NOTE — Assessment & Plan Note (Signed)
The patient has not had any recurrent symptoms of angina.  She remains on baby aspirin 81 mg daily.  She has a past history of CABG and of aortic valve replacement with bioprosthetic valve.

## 2013-08-02 NOTE — Assessment & Plan Note (Signed)
Blood pressure was high on arrival today but attributable to the winter  weather outside.  On repeat her blood pressure was down to 140/78.  We are stopping losartan because of cough.  She is allergic to ACE inhibitors and to 80 RBCs.  To take the place of losartan we will double her amlodipine to 10 mg daily

## 2013-08-02 NOTE — Progress Notes (Signed)
Amber [name] Date of Birth:  [birth year] [address] [place], [place]  [address] [phone]         Fax   [phone]  History of Present Illness: This pleasant 78 year old woman is seen for a followup office visit. . She has a past history of ischemic heart disease and a past history of aortic stenosis. She underwent CABG and aortic valve replacement with a tissue aortic valve on 01/10/09. She also has a past history of paroxysmal atrial fibrillation and is maintaining normal sinus rhythm on amiodarone 200 mg She is on Xarelto 15 mg daily for anticoagulation. She was recently hospitalized for cough and hemoptysis. She was admitted on 05/19/12 and discharged on 05/20/12. While in the hospital she was noted to have marked sinus bradycardia and her low dose beta blocker was stopped and her amiodarone was continued. While on telemetry she did not have any evidence of recurrent atrial fib. The patient has a history of gout and is on medication to prevent acute gout flareup. The patient has been complaining of increased malaise and fatigue. She has a past history of iron deficiency anemia several years ago.  She was recently seen on 01/05/13 at which time she was noted to have marked sinus bradycardia wall on beta blocker.  Her beta blocker was stopped and she returns today improved.  She has remained in normal sinus rhythm on amiodarone.  She has a past history of marked weakness secondary to potassium of 2.6 secondary to hydrochlorothiazide. She has continued to have a bad cough despite reduction of dose of losartan   Current Outpatient Prescriptions  Medication Sig Dispense Refill  . ALPRAZolam (XANAX) 0.25 MG tablet Take 0.25 mg by mouth 3 (three) times daily as needed. For anxiety      . amiodarone (PACERONE) 200 MG tablet TAKE 1 TABLET (200 MG TOTAL) BY MOUTH DAILY.  30 tablet  1  . amLODipine (NORVASC) 5 MG tablet Take 5 mg by mouth daily.      Marland Kitchen aspirin 81 MG tablet Take 81 mg  by mouth daily.        Marland Kitchen atorvastatin (LIPITOR) 40 MG tablet TAKE 1 TABLET (40 MG TOTAL) BY MOUTH DAILY.  30 tablet  6  . Calcium Carbonate-Vit D-Min (CALCIUM 1200 PO) Take by mouth daily.        . clotrimazole-betamethasone (LOTRISONE) cream Apply topically as needed.      . colchicine 0.6 MG tablet Take 0.6 mg by mouth daily as needed (for gout flare ups, usually takes for 3 days).       Marland Kitchen dextromethorphan (DELSYM) 30 MG/5ML liquid Take 60 mg by mouth as needed.      . docusate sodium (COLACE) 100 MG capsule Take 100 mg by mouth daily as needed for constipation. As needed  10 capsule  0  . famotidine (PEPCID) 20 MG tablet Take 1 tablet (20 mg total) by mouth 2 (two) times daily.  60 tablet  6  . fluticasone (FLONASE) 50 MCG/ACT nasal spray Place 1 spray into the nose daily as needed for rhinitis.       . furosemide (LASIX) 20 MG tablet Take 20 mg by mouth as directed. 2 daily      . losartan (COZAAR) 25 MG tablet Take 1/2 tablet by mouth daily      . meclizine (ANTIVERT) 12.5 MG tablet Take 12.5 mg by mouth 3 (three) times daily as needed. For dizziness      .  nitroGLYCERIN (NITROSTAT) 0.4 MG SL tablet Place 1 tablet (0.4 mg total) under the tongue every 5 (five) minutes as needed.  25 tablet  prn  . potassium chloride SA (K-DUR,KLOR-CON) 20 MEQ tablet Take 20 mEq by mouth 2 (two) times daily.      . raloxifene (EVISTA) 60 MG tablet Take 60 mg by mouth daily.        . vitamin B-12 (CYANOCOBALAMIN) 1000 MCG tablet Take 1,000 mcg by mouth daily.      [name]. XARELTO 15 MG TABS tablet TAKE 1 TABLET (15 MG TOTAL) BY MOUTH DAILY WITH SUPPER.  30 tablet  6   No current facility-administered medications for this visit.    Allergies  Allergen Reactions  . Bystolic [Nebivolol Hcl]     Bradycardia.  . Lisinopril Cough  . Losartan     cough  . Metformin Nausea Only    nausea  . Metoprolol     Bradycardia and weakness  . Fluconazole Rash    Patient Active Problem List   Diagnosis Date Noted  .  PAF (paroxysmal atrial fibrillation)     Priority: Medium  . Malaise and fatigue 09/25/2012  . Cough 05/19/2012  . CKD (chronic kidney disease) stage 3, GFR 30-59 ml/min 05/19/2012  . Hemoptysis 05/19/2012  . Hypokalemia 05/19/2012  . Pulmonary nodule 05/19/2012  . Sinus bradycardia 05/19/2012  . Acute on chronic diastolic CHF (congestive heart failure), NYHA class 2 05/19/2012  . History of pulmonary embolus (PE)   . Iron deficiency anemia   . Vitamin B12 deficiency 10/11/2011  . Bronchitis 10/11/2011  . Hx of CABG 10/14/2010  . S/P aortic valve replacement with bioprosthetic valve 10/14/2010  . HTN (hypertension)   . Chronic anticoagulation   . CAD (coronary artery disease) 12/12/2008    History  Smoking status  . Never Smoker   Smokeless tobacco  . Never Used    History  Alcohol Use No    Family History  Problem Relation Age of Onset  . Stroke Mother   . Stroke Father   . Heart disease Brother   . Heart disease Brother     Review of Systems: Constitutional: no fever chills diaphoresis or fatigue or change in weight.  Head and neck: no hearing loss, no epistaxis, no photophobia or visual disturbance. Respiratory: No cough, shortness of breath or wheezing. Cardiovascular: No chest pain peripheral edema, palpitations. Gastrointestinal: No abdominal distention, no abdominal pain, no change in bowel habits hematochezia or melena. Genitourinary: No dysuria, no frequency, no urgency, no nocturia. Musculoskeletal:No arthralgias, no back pain, no gait disturbance or myalgias. Neurological: No dizziness, no headaches, no numbness, no seizures, no syncope, no weakness, no tremors. Hematologic: No lymphadenopathy, no easy bruising. Psychiatric: No confusion, no hallucinations, no sleep disturbance.    Physical Exam: Filed Vitals:   08/02/13 0930  BP: 140/78  Pulse:    the general appearance reveals a well-developed well-nourished elderly woman in no distress.The head  and neck exam reveals pupils equal and reactive.  Extraocular movements are full.  There is no scleral icterus.  The mouth and pharynx are normal.  The neck is supple.  The carotids reveal no bruits.  The jugular venous pressure is normal.  The  thyroid is not enlarged.  There is no lymphadenopathy.  The chest is clear to percussion and auscultation.  There are no rales or rhonchi.  Expansion of the chest is symmetrical.  The precordium is quiet.  The first heart sound is normal.  The  second heart sound is physiologically split.  There is faint systolic murmur across her prosthetic aortic valve.  No diastolic murmur..  There is no abnormal lift or heave.  The abdomen is soft and nontender.  The bowel sounds are normal.  The liver and spleen are not enlarged.  There are no abdominal masses.  There are no abdominal bruits.  Extremities reveal good pedal pulses.  There is no phlebitis or edema.  There is no cyanosis or clubbing.  Strength is normal and symmetrical in all extremities.  There is no lateralizing weakness.  There are no sensory deficits.  The skin is warm and dry.  There is no rash.    Assessment / Plan: Continue same medication except stop losartan and increased amlodipine up to 10 mg daily.  We are checking blood work today including CBC lipid panel hepatic function panel and basal metabolic panel.  She'll be rechecked in 3 months for office visit EKG and basal metabolic panel.

## 2013-08-02 NOTE — Assessment & Plan Note (Signed)
The patient has had no recurrence of her atrial fibrillation that she is aware of.  She remains on Xarelto 15 mg daily.  Her last GFR was 38.  She is not having any bleeding problems from the Xarelto.

## 2013-08-02 NOTE — Assessment & Plan Note (Signed)
The patient has not had any symptoms of exacerbation of CHF.  No orthopnea or PND or peripheral edema

## 2013-08-02 NOTE — Patient Instructions (Signed)
Will obtain labs today and call you with the results (CMET/TSH/LP/CBC)  STOP LOSARTAN   INCREASE YOUR AMLODIPINE TO 10 MG DAILY, NEW RX SENT TO [facility]  Your physician recommends that you schedule a follow-up appointment in: 3 MONTH OV/EKG/BMET

## 2013-08-03 ENCOUNTER — Telehealth: Payer: Self-pay | Admitting: Cardiology

## 2013-08-03 NOTE — Telephone Encounter (Signed)
Mailed copy of labs and left message to call if any questions  

## 2013-08-03 NOTE — Progress Notes (Signed)
Quick Note:  Please report to patient. The recent labs are stable. Continue same medication and careful diet. Her hemoglobin is 12.4 which is satisfactory for her. Her thyroid test is normal. Kidney and liver function is stable. Continue same medication. ______

## 2013-08-03 NOTE — Telephone Encounter (Signed)
New message ° ° ° ° ° °Returning a nurses call °

## 2013-08-03 NOTE — Telephone Encounter (Signed)
Message copied by Burnell BlanksPRATT, Edda Orea B on Fri Aug 03, 2013  3:06 PM ------      Message from: Cassell ClementBRACKBILL, THOMAS      Created: Fri Aug 03, 2013  7:53 AM       Please report to patient.  The recent labs are stable. Continue same medication and careful diet.  Her hemoglobin is 12.4 which is satisfactory for her.  Her thyroid test is normal.  Kidney and liver function is stable.  Continue same medication. ------

## 2013-08-15 ENCOUNTER — Other Ambulatory Visit: Payer: Self-pay | Admitting: Cardiology

## 2013-08-15 ENCOUNTER — Telehealth: Payer: Self-pay | Admitting: Cardiology

## 2013-08-15 DIAGNOSIS — I119 Hypertensive heart disease without heart failure: Secondary | ICD-10-CM

## 2013-08-15 MED ORDER — HYDRALAZINE HCL 25 MG PO TABS: 25.0000 mg | ORAL_TABLET | Freq: Three times a day (TID) | ORAL | Status: AC

## 2013-08-15 NOTE — Telephone Encounter (Signed)
New message     B/P today was 170/70.  Patient complain of headache.  Do you want to adjust her medications?

## 2013-08-15 NOTE — Telephone Encounter (Signed)
Has not been feeling well, headache since ov. Discussed with Dr Clifton JamesMcAlhany and will have patient start Hydralazine 25 mg three times a day. Advised patient. She will continue to monitor her blood pressure and let us know if no improvement.

## 2013-08-16 NOTE — Telephone Encounter (Signed)
Agree with plan 

## 2013-08-17 ENCOUNTER — Other Ambulatory Visit: Payer: Self-pay | Admitting: Cardiology

## 2013-09-21 ENCOUNTER — Other Ambulatory Visit: Payer: Self-pay | Admitting: Cardiology

## 2013-10-23 ENCOUNTER — Other Ambulatory Visit: Payer: Medicare Other

## 2013-10-23 ENCOUNTER — Ambulatory Visit (INDEPENDENT_AMBULATORY_CARE_PROVIDER_SITE_OTHER): Payer: Medicare Other | Admitting: Cardiology

## 2013-10-23 ENCOUNTER — Encounter: Payer: Self-pay | Admitting: Cardiology

## 2013-10-23 VITALS — BP 138/70 | HR 66 | Ht 65.0 in | Wt 208.0 lb

## 2013-10-23 DIAGNOSIS — I119 Hypertensive heart disease without heart failure: Secondary | ICD-10-CM

## 2013-10-23 DIAGNOSIS — R5383 Other fatigue: Secondary | ICD-10-CM

## 2013-10-23 DIAGNOSIS — I4891 Unspecified atrial fibrillation: Secondary | ICD-10-CM

## 2013-10-23 DIAGNOSIS — I2510 Atherosclerotic heart disease of native coronary artery without angina pectoris: Secondary | ICD-10-CM

## 2013-10-23 DIAGNOSIS — Z952 Presence of prosthetic heart valve: Secondary | ICD-10-CM

## 2013-10-23 DIAGNOSIS — I480 Paroxysmal atrial fibrillation: Secondary | ICD-10-CM

## 2013-10-23 DIAGNOSIS — R5381 Other malaise: Secondary | ICD-10-CM

## 2013-10-23 DIAGNOSIS — Z951 Presence of aortocoronary bypass graft: Secondary | ICD-10-CM

## 2013-10-23 DIAGNOSIS — Z953 Presence of xenogenic heart valve: Secondary | ICD-10-CM

## 2013-10-23 DIAGNOSIS — M109 Gout, unspecified: Secondary | ICD-10-CM | POA: Insufficient documentation

## 2013-10-23 LAB — CBC WITH DIFFERENTIAL/PLATELET
BASOS ABS: 0 10*3/uL (ref 0.0–0.1)
BASOS PCT: 0.3 % (ref 0.0–3.0)
EOS ABS: 0.1 10*3/uL (ref 0.0–0.7)
Eosinophils Relative: 0.9 % (ref 0.0–5.0)
HCT: 38.7 % (ref 36.0–46.0)
Hemoglobin: 12.8 g/dL (ref 12.0–15.0)
LYMPHS PCT: 11.6 % — AB (ref 12.0–46.0)
Lymphs Abs: 1.4 10*3/uL (ref 0.7–4.0)
MCHC: 33.1 g/dL (ref 30.0–36.0)
MCV: 84.7 fl (ref 78.0–100.0)
Monocytes Absolute: 0.4 10*3/uL (ref 0.1–1.0)
Monocytes Relative: 3.7 % (ref 3.0–12.0)
NEUTROS PCT: 83.5 % — AB (ref 43.0–77.0)
Neutro Abs: 10 10*3/uL — ABNORMAL HIGH (ref 1.4–7.7)
Platelets: 342 10*3/uL (ref 150.0–400.0)
RBC: 4.57 Mil/uL (ref 3.87–5.11)
RDW: 15.1 % (ref 11.5–15.5)
WBC: 12 10*3/uL — ABNORMAL HIGH (ref 4.0–10.5)

## 2013-10-23 LAB — BASIC METABOLIC PANEL
BUN: 23 mg/dL (ref 6–23)
CO2: 26 meq/L (ref 19–32)
Calcium: 9.2 mg/dL (ref 8.4–10.5)
Chloride: 102 mEq/L (ref 96–112)
Creatinine, Ser: 1.5 mg/dL — ABNORMAL HIGH (ref 0.4–1.2)
GFR: 36.21 mL/min — ABNORMAL LOW (ref >60.00)
Glucose, Bld: 103 mg/dL — ABNORMAL HIGH (ref 70–99)
Potassium: 3.7 mEq/L (ref 3.5–5.1)
Sodium: 138 mEq/L (ref 135–145)

## 2013-10-23 LAB — TSH: TSH: 2.82 u[IU]/mL (ref 0.35–4.50)

## 2013-10-23 LAB — HEPATIC FUNCTION PANEL
ALK PHOS: 125 U/L — AB (ref 39–117)
ALT: 15 U/L (ref 0–35)
AST: 23 U/L (ref 0–37)
Albumin: 3.8 g/dL (ref 3.5–5.2)
Bilirubin, Direct: 0.1 mg/dL (ref 0.0–0.3)
TOTAL PROTEIN: 7.7 g/dL (ref 6.0–8.3)
Total Bilirubin: 1.1 mg/dL (ref 0.2–1.2)

## 2013-10-23 LAB — URIC ACID: Uric Acid, Serum: 7.5 mg/dL — ABNORMAL HIGH (ref 2.4–7.0)

## 2013-10-23 MED ORDER — COLCHICINE 0.6 MG PO TABS: ORAL_TABLET | ORAL | Status: AC

## 2013-10-23 NOTE — Assessment & Plan Note (Signed)
She is having increased malaise and fatigue.  She has not been aware of any GI blood loss or hematochezia or melena.  We will check full labs today including CBC uric acid hepatic function panel basal metabolic panel and TSH.

## 2013-10-23 NOTE — Progress Notes (Signed)
Amber [name] Date of Birth:  10/16/1935 [facility] [address] [place], [place]  [id number] [phone]        Fax   [phone]   History of Present Illness: This pleasant 78 year old woman is seen for a followup office visit. . She has a past history of ischemic heart disease and a past history of aortic stenosis. She underwent CABG and aortic valve replacement with a tissue aortic valve on 01/10/09. She also has a past history of paroxysmal atrial fibrillation and is maintaining normal sinus rhythm on amiodarone 200 mg.  She is on Xarelto 15 mg daily for anticoagulation. She was  hospitalized for cough and hemoptysis  on 05/19/12 and discharged on 05/20/12. While in the hospital she was noted to have marked sinus bradycardia and her low dose beta blocker was stopped and her amiodarone was continued. While on telemetry she did not have any evidence of recurrent atrial fib. The patient has a history of gout and is on medication to prevent acute gout flareup. The patient has been complaining of increased malaise and fatigue. She has a past history of iron deficiency anemia several years ago.  She was recently seen on 01/05/13 at which time she was noted to have marked sinus bradycardia wall on beta blocker. Her beta blocker was stopped and she  improved. She has remained in normal sinus rhythm on amiodarone. She has a past history of marked weakness secondary to potassium of 2.6 secondary to hydrochlorothiazide.  Since last visit she has complained of increased fatigue and more dyspnea.  She's had occasional chest tightness.  The chest tightness can occur at rest or with exercise.  She also notes some leg weakness and has rare cramps in her legs.  She has also been experiencing some questionable gout in her left lateral foot.  She has had some problems with dysphagia after she swallows her potassium pills. Her last echocardiogram was 01/31/12 and showed an ejection fraction of  55-60%.  She has not had a recent Lexiscan.   Current Outpatient Prescriptions  Medication Sig Dispense Refill  . amiodarone (PACERONE) 200 MG tablet TAKE 1 TABLET (200 MG TOTAL) BY MOUTH DAILY.  30 tablet  1  . amLODipine (NORVASC) 10 MG tablet Take 1 tablet (10 mg total) by mouth daily.  90 tablet  3  . aspirin 81 MG tablet Take 81 mg by mouth daily.        Marland Kitchen. atorvastatin (LIPITOR) 40 MG tablet TAKE 1 TABLET (40 MG TOTAL) BY MOUTH DAILY.  30 tablet  2  . Calcium Carbonate-Vit D-Min (CALCIUM 1200 PO) Take by mouth daily.        . clotrimazole-betamethasone (LOTRISONE) cream Apply topically as needed.      Marland Kitchen. dextromethorphan (DELSYM) 30 MG/5ML liquid Take 60 mg by mouth as needed.      . diptheria-tetanus toxoids (DECAVAC) 2-2 LF/0.5ML injection       . famotidine (PEPCID) 20 MG tablet Take 1 tablet (20 mg total) by mouth 2 (two) times daily.  60 tablet  6  . fluticasone (FLONASE) 50 MCG/ACT nasal spray Place 1 spray into the nose daily as needed for rhinitis.       . furosemide (LASIX) 20 MG tablet Take 20 mg by mouth as directed. 2 daily      . hydrALAZINE (APRESOLINE) 25 MG tablet Take 1 tablet (25 mg total) by mouth 3 (three) times daily.  90 tablet  3  .  potassium chloride SA (K-DUR,KLOR-CON) 20 MEQ tablet Take 20 mEq by mouth 2 (two) times daily.      . raloxifene (EVISTA) 60 MG tablet Take 60 mg by mouth daily.        . vitamin B-12 (CYANOCOBALAMIN) 1000 MCG tablet Take 1,000 mcg by mouth daily.      [name] 15 MG TABS tablet TAKE 1 TABLET (15 MG TOTAL) BY MOUTH DAILY WITH SUPPER.  30 tablet  6  . colchicine 0.6 MG tablet 1 to 2 tablets one to two times a day as needed for acute gout flare up  30 tablet  5   No current facility-administered medications for this visit.    Allergies  Allergen Reactions  . Bystolic [Nebivolol Hcl]     Bradycardia.  . Lisinopril Cough  . Losartan     cough  . Metformin Nausea Only    nausea  . Metoprolol     Bradycardia and weakness  .  Fluconazole Rash    Patient Active Problem List   Diagnosis Date Noted  . PAF (paroxysmal atrial fibrillation)     Priority: Medium  . Gout 10/23/2013  . Malaise and fatigue 09/25/2012  . Cough 05/19/2012  . CKD (chronic kidney disease) stage 3, GFR 30-59 ml/min 05/19/2012  . Hemoptysis 05/19/2012  . Hypokalemia 05/19/2012  . Pulmonary nodule 05/19/2012  . Sinus bradycardia 05/19/2012  . Acute on chronic diastolic CHF (congestive heart failure), NYHA class 2 05/19/2012  . History of pulmonary embolus (PE)   . Iron deficiency anemia   . Vitamin B12 deficiency 10/11/2011  . Bronchitis 10/11/2011  . Hx of CABG 10/14/2010  . S/P aortic valve replacement with bioprosthetic valve 10/14/2010  . HTN (hypertension)   . Chronic anticoagulation   . CAD (coronary artery disease) 12/12/2008    History  Smoking status  . Never Smoker   Smokeless tobacco  . Never Used    History  Alcohol Use No    Family History  Problem Relation Age of Onset  . Stroke Mother   . Stroke Father   . Heart disease Brother   . Heart disease Brother     Review of Systems: Constitutional: no fever chills diaphoresis or fatigue or change in weight.  Head and neck: no hearing loss, no epistaxis, no photophobia or visual disturbance. Respiratory: No cough, shortness of breath or wheezing. Cardiovascular: No chest pain peripheral edema, palpitations. Gastrointestinal: No abdominal distention, no abdominal pain, no change in bowel habits hematochezia or melena. Genitourinary: No dysuria, no frequency, no urgency, no nocturia. Musculoskeletal:No arthralgias, no back pain, no gait disturbance or myalgias. Neurological: No dizziness, no headaches, no numbness, no seizures, no syncope, no weakness, no tremors. Hematologic: No lymphadenopathy, no easy bruising. Psychiatric: No confusion, no hallucinations, no sleep disturbance.    Physical Exam: Filed Vitals:   10/23/13 0938  BP: 138/70  Pulse: 66    the general appearance reveals a well-developed well-nourished woman in no distress.The head and neck exam reveals pupils equal and reactive.  Extraocular movements are full.  There is no scleral icterus.  The mouth and pharynx are normal.  The neck is supple.  The carotids reveal no bruits.  The jugular venous pressure is normal.  The  thyroid is not enlarged.  There is no lymphadenopathy.  The chest is clear to percussion and auscultation.  There are no rales or rhonchi.  Expansion of the chest is symmetrical.  The precordium is quiet.  The first heart sound is  normal.  The second heart sound is physiologically split.  There is no  gallop rub or click.  There is a soft systolic ejection murmur across the prosthetic aortic valve.  There is no abnormal lift or heave.  The abdomen is soft and nontender.  The bowel sounds are normal.  The liver and spleen are not enlarged.  There are no abdominal masses.  There are no abdominal bruits.  Extremities reveal good pedal pulses.  There is no phlebitis or edema.  There is no cyanosis or clubbing.  Strength is normal and symmetrical in all extremities.  There is no lateralizing weakness.  There are no sensory deficits.  The skin is warm and dry.  There is no rash.  EKG shows sinus rhythm with sinus arrhythmia and first degree AV block and nonspecific T-wave flattening   Assessment / Plan: 1.  Status post CABG and aortic valve replacement with a bioprosthetic aortic valve on 01/10/09 2. paroxysmal atrial fibrillation, maintaining normal sinus rhythm on amiodarone and Xarelto. 3. exertional dyspnea and exertional chest tightness uncertain etiology. 4. past history of anemia 5. essential hypertension  Plan: Check chest x-ray, 2-D echo, and Myoview stress test.  Lab work today pending  Recheck in 3 months for office visit and EKG.  Consider adding allopurinol uric acid comes back elevated.

## 2013-10-23 NOTE — Assessment & Plan Note (Signed)
To evaluate her symptoms of increased shortness of breath and intermittent chest tightness we will have her return for a chest x-ray, 2-D echo, and likely scan Myoview stress test.

## 2013-10-23 NOTE — Patient Instructions (Addendum)
Will obtain labs today and call you with the results (bmet,hfp,tsh,uric acid/cbc)  A chest x-ray takes a picture of the organs and structures inside the chest, including the heart, lungs, and blood vessels. This test can show several things, including, whether the heart is enlarges; whether fluid is building up in the lungs; and whether pacemaker / defibrillator leads are still in place. [place] [facility]  Your physician has requested that you have an echocardiogram. Echocardiography is a painless test that uses sound waves to create images of your heart. It provides your doctor with information about the size and shape of your heart and how well your heart's chambers and valves are working. This procedure takes approximately one hour. There are no restrictions for this procedure.  Your physician has requested that you have a lexiscan myoview. For further information please visit [email or web address]. Please follow instruction sheet, as given.  Your physician recommends that you schedule a follow-up appointment in: 3 month ov/ekg

## 2013-10-23 NOTE — Assessment & Plan Note (Signed)
She has had a history of gout.  Her insurance would not pay for Colcrys.  We will see what  her uric acid is.  Consider adding allopurinol.

## 2013-10-24 ENCOUNTER — Other Ambulatory Visit: Payer: Self-pay | Admitting: Cardiology

## 2013-10-24 NOTE — Telephone Encounter (Signed)
refill 

## 2013-10-25 ENCOUNTER — Telehealth: Payer: Self-pay | Admitting: *Deleted

## 2013-10-25 DIAGNOSIS — M109 Gout, unspecified: Secondary | ICD-10-CM

## 2013-10-25 MED ORDER — ALLOPURINOL 100 MG PO TABS: 100.0000 mg | ORAL_TABLET | Freq: Every day | ORAL | Status: DC

## 2013-10-25 NOTE — Telephone Encounter (Signed)
Message copied by Burnell BlanksPRATT, Abe Schools B on Thu Oct 25, 2013  5:49 PM ------      Message from: Cassell ClementBRACKBILL, THOMAS      Created: Thu Oct 25, 2013  7:19 AM       It will not cover Colcrys. I would be very surprised if it would not cover allopurinol which is very cheap and generic. ------

## 2013-10-25 NOTE — Telephone Encounter (Signed)
Advised patient and Rx sent to pharmacy   Notes Recorded by Deliah Goodyebra Mathis, RN on 10/24/2013 at 4:03 PM pt aware of results  Her insurance will not cover the allopurinol  ? What else she can take Will forward for dr brackbill's review Notes Recorded by Cassell Clementhomas Brackbill, MD on 10/23/2013 at 9:28 PM Please report. Thyroid level is okay. No anemia. WBC count is elevated possibly from the acute gout attack. Uric acid level is high so start taking allopurinol 100 mg daily. LFTs okay. Kidney function is stable. Potassium 3.7 okay

## 2013-11-08 ENCOUNTER — Ambulatory Visit (HOSPITAL_COMMUNITY): Payer: Medicare Other | Attending: Cardiovascular Disease | Admitting: Radiology

## 2013-11-08 ENCOUNTER — Ambulatory Visit (HOSPITAL_BASED_OUTPATIENT_CLINIC_OR_DEPARTMENT_OTHER): Payer: Medicare Other | Admitting: Radiology

## 2013-11-08 VITALS — BP 149/67 | Ht 65.0 in | Wt 208.0 lb

## 2013-11-08 DIAGNOSIS — Z951 Presence of aortocoronary bypass graft: Secondary | ICD-10-CM

## 2013-11-08 DIAGNOSIS — I480 Paroxysmal atrial fibrillation: Secondary | ICD-10-CM

## 2013-11-08 DIAGNOSIS — R0609 Other forms of dyspnea: Secondary | ICD-10-CM | POA: Insufficient documentation

## 2013-11-08 DIAGNOSIS — I4891 Unspecified atrial fibrillation: Secondary | ICD-10-CM

## 2013-11-08 DIAGNOSIS — R079 Chest pain, unspecified: Secondary | ICD-10-CM | POA: Insufficient documentation

## 2013-11-08 DIAGNOSIS — R0602 Shortness of breath: Secondary | ICD-10-CM

## 2013-11-08 DIAGNOSIS — I2510 Atherosclerotic heart disease of native coronary artery without angina pectoris: Secondary | ICD-10-CM

## 2013-11-08 DIAGNOSIS — R0989 Other specified symptoms and signs involving the circulatory and respiratory systems: Secondary | ICD-10-CM | POA: Insufficient documentation

## 2013-11-08 MED ORDER — TECHNETIUM TC 99M SESTAMIBI GENERIC - CARDIOLITE
10.0000 | Freq: Once | INTRAVENOUS | Status: AC | PRN
  Administered 2013-11-08: 10 via INTRAVENOUS

## 2013-11-08 MED ORDER — REGADENOSON 0.4 MG/5ML IV SOLN
0.4000 mg | Freq: Once | INTRAVENOUS | Status: AC
  Administered 2013-11-08: 0.4 mg via INTRAVENOUS

## 2013-11-08 MED ORDER — TECHNETIUM TC 99M SESTAMIBI GENERIC - CARDIOLITE
30.0000 | Freq: Once | INTRAVENOUS | Status: AC | PRN
  Administered 2013-11-08: 30 via INTRAVENOUS

## 2013-11-08 NOTE — Progress Notes (Signed)
Echocardiogram performed.  

## 2013-11-08 NOTE — Progress Notes (Signed)
[facility] SITE 3 NUCLEAR MED [address] [place], [place] [address] [phone]    Cardiology Nuclear Med Study  Amber [name] is a 78 y.o. female     MRN : [id number]     DOB: 03/17/1936  Procedure Date: 11/08/2013  Nuclear Med Background Indication for Stress Test:  Evaluation for Ischemia and Graft Patency History:  HX CABG, AVR, 2015 ECHO- EF 60-65%, 2011 MPI Normal Cardiac Risk Factors: Family History - CAD, Hypertension, Lipids and NIDDM  Symptoms:  Chest Pain, DOE and SOB   Nuclear Pre-Procedure Caffeine/Decaff Intake:  None> 12 hrs NPO After: 7:30am   Lungs:  clear O2 Sat: 96% on room air. IV 0.9% NS with Angio Cath:  22g  IV Site: L Antecubital x 1, tolerated well IV Started by:  Irean Hong, RN  Chest Size (in):  40 Cup Size: D  Height: 5\' 5"  (1.651 m)  Weight:  208 lb (94.348 kg)  BMI:  Body mass index is 34.61 kg/(m^2). Tech Comments:  N/A    Nuclear Med Study 1 or 2 day study: 1 day  Stress Test Type:  Lexiscan  Reading MD: N/A  Order Authorizing Provider:  Cassell Clement, MD  Resting Radionuclide: Technetium 31m Sestamibi  Resting Radionuclide Dose: 11.0 mCi   Stress Radionuclide:  Technetium 10m Sestamibi  Stress Radionuclide Dose: 33.0 mCi           Stress Protocol Rest HR: 63 Stress HR: 77  Rest BP: 149/67 Stress BP: 160/66  Exercise Time (min): n/a METS: n/a   Predicted Max HR: 145 bpm % Max HR: 53.1 bpm Rate Pressure Product: 66063   Dose of Adenosine (mg):  n/a Dose of Lexiscan: 0.4 mg  Dose of Atropine (mg): n/a Dose of Dobutamine: n/a mcg/kg/min (at max HR)  Stress Test Technologist: Bonnita Levan, RN  Nuclear Technologist:  Harlow Asa, CNMT     Rest Procedure:  Myocardial perfusion imaging was performed at rest 45 minutes following the intravenous administration of Technetium 21m Sestamibi. Rest ECG: NSR with anteroseptal infarct and nonspecific T wave abnormality  Stress Procedure:  The patient received IV Lexiscan  0.4 mg over 15-seconds.  Technetium 59m Sestamibi injected at 30-seconds.  Quantitative spect images were obtained after a 45 minute delay. Stress ECG: No significant ST segment change from baseline EKG.  QPS Raw Data Images:  Normal; no motion artifact; normal heart/lung ratio. Stress Images:  Normal homogeneous uptake in all areas of the myocardium. Rest Images:  Normal homogeneous uptake in all areas of the myocardium. Subtraction (SDS):  No evidence of ischemia. Transient Ischemic Dilatation (Normal <1.22):  1.10 Lung/Heart Ratio (Normal <0.45):  0.42  Quantitative Gated Spect Images QGS EDV:  NA QGS ESV:  NA  Impression Exercise Capacity:  Lexiscan with no exercise. BP Response:  Hypotensive blood pressure response during Lexiscan infusion. Clinical Symptoms:  No symptoms. ECG Impression:  No significant ST segment change suggestive of ischemia. Comparison with Prior Nuclear Study: No images to compare  Overall Impression:  Low risk stress nuclear study No evidence of ischemia. Study was not gated due to PVC's.  If patient has not had an echo would consider to evaluate EF.  LV Ejection Fraction: Study not gated.  LV Wall Motion:  NA  Signed: Armanda Magic, MD [facility]

## 2013-11-09 ENCOUNTER — Ambulatory Visit
Admission: RE | Admit: 2013-11-09 | Discharge: 2013-11-09 | Disposition: A | Discharge status: Home / Self Care | Payer: Medicare Other | Source: Ambulatory Visit | Attending: Cardiology | Admitting: Cardiology

## 2013-11-09 ENCOUNTER — Telehealth: Payer: Self-pay | Admitting: *Deleted

## 2013-11-09 DIAGNOSIS — I480 Paroxysmal atrial fibrillation: Secondary | ICD-10-CM

## 2013-11-09 DIAGNOSIS — Z953 Presence of xenogenic heart valve: Secondary | ICD-10-CM

## 2013-11-09 NOTE — Telephone Encounter (Signed)
Message copied by Burnell Blanks on Fri Nov 09, 2013  5:54 PM ------      Message from: Cassell Clement      Created: Fri Nov 09, 2013  1:49 PM       Please report.  Stress test showed no ischemia. ------

## 2013-11-09 NOTE — Telephone Encounter (Signed)
Not sure what is causing her symptoms.  It may be side effect of the amiodarone. Reduce amiodarone down to 100 mg daily and observe response.

## 2013-11-09 NOTE — Telephone Encounter (Signed)
Message copied by Burnell Blanks on Fri Nov 09, 2013  5:54 PM ------      Message from: Cassell Clement      Created: Fri Nov 09, 2013  4:31 PM       Please report.  The chest x-ray was stable ------

## 2013-11-09 NOTE — Telephone Encounter (Signed)
Patient states she is still having a lot of problems with shortness of breath and feeling like she is going to pass out. She is concerned coming from a medication. Will forward to  Dr. Patty Sermons for review   Advised of results.

## 2013-11-09 NOTE — Telephone Encounter (Signed)
Advised patient

## 2013-11-09 NOTE — Telephone Encounter (Signed)
Message copied by Burnell Blanks on Fri Nov 09, 2013 11:26 AM ------      Message from: Cassell Clement      Created: Thu Nov 08, 2013  2:48 PM       Please report.  The echocardiogram is satisfactory.  Left ventricular function is normal.  The prosthetic aortic valve is working normally. ------

## 2013-11-12 NOTE — Telephone Encounter (Signed)
Advised patient

## 2013-11-20 ENCOUNTER — Other Ambulatory Visit: Payer: Self-pay | Admitting: Cardiology

## 2013-12-24 ENCOUNTER — Other Ambulatory Visit: Payer: Self-pay | Admitting: Cardiology

## 2014-01-23 ENCOUNTER — Encounter: Payer: Self-pay | Admitting: Cardiology

## 2014-01-23 ENCOUNTER — Ambulatory Visit (INDEPENDENT_AMBULATORY_CARE_PROVIDER_SITE_OTHER): Payer: Medicare Other | Admitting: Cardiology

## 2014-01-23 VITALS — BP 128/72 | HR 66 | Ht 65.0 in | Wt 196.1 lb

## 2014-01-23 DIAGNOSIS — I5033 Acute on chronic diastolic (congestive) heart failure: Secondary | ICD-10-CM

## 2014-01-23 DIAGNOSIS — R634 Abnormal weight loss: Secondary | ICD-10-CM

## 2014-01-23 DIAGNOSIS — I480 Paroxysmal atrial fibrillation: Secondary | ICD-10-CM

## 2014-01-23 DIAGNOSIS — I509 Heart failure, unspecified: Secondary | ICD-10-CM

## 2014-01-23 DIAGNOSIS — I4891 Unspecified atrial fibrillation: Secondary | ICD-10-CM

## 2014-01-23 DIAGNOSIS — I25810 Atherosclerosis of coronary artery bypass graft(s) without angina pectoris: Secondary | ICD-10-CM

## 2014-01-23 DIAGNOSIS — I119 Hypertensive heart disease without heart failure: Secondary | ICD-10-CM

## 2014-01-23 DIAGNOSIS — Z7901 Long term (current) use of anticoagulants: Secondary | ICD-10-CM

## 2014-01-23 DIAGNOSIS — E876 Hypokalemia: Secondary | ICD-10-CM

## 2014-01-23 MED ORDER — POTASSIUM CHLORIDE CRYS ER 10 MEQ PO TBCR: 10.0000 meq | EXTENDED_RELEASE_TABLET | Freq: Three times a day (TID) | ORAL | Status: DC

## 2014-01-23 NOTE — Assessment & Plan Note (Signed)
She is not having any symptoms of CHF.  Chest x-ray on 11/09/13 showed no active disease.

## 2014-01-23 NOTE — Assessment & Plan Note (Signed)
The patient has not been experiencing any chest pain or angina. 

## 2014-01-23 NOTE — Patient Instructions (Signed)
STOP AMIODARONE   DECREASE YOUR LASIX (FUROSEMIDE) TO 20 MG DAILY  CHANGE KDUR (POTASSIUM) TO 10 MEQ THREE TIMES A DAY  Your physician recommends that you schedule a follow-up appointment in: 2 MONTH OV/EKG,BMET,CBC,TSH

## 2014-01-23 NOTE — Progress Notes (Signed)
[name] Date of Birth:  1936-03-09 [facility] [address] [place], [place]  [address] [phone]        Fax   [phone]   History of Present Illness: This pleasant 78 year old woman is seen for a followup office visit. . She has a past history of ischemic heart disease and a past history of aortic stenosis. She underwent CABG and aortic valve replacement with a tissue aortic valve on 01/10/09. She also has a past history of paroxysmal atrial fibrillation and is maintaining normal sinus rhythm on amiodarone 200 mg. She is on Xarelto 15 mg daily for anticoagulation. She was hospitalized for cough and hemoptysis on 05/19/12 and discharged on 05/20/12. While in the hospital she was noted to have marked sinus bradycardia and her low dose beta blocker was stopped and her amiodarone was continued. While on telemetry she did not have any evidence of recurrent atrial fib. The patient has a history of gout and is on medication to prevent acute gout flareup. The patient has been complaining of increased malaise and fatigue. She has a past history of iron deficiency anemia several years ago.  She was recently seen on 01/05/13 at which time she was noted to have marked sinus bradycardia wall on beta blocker. Her beta blocker was stopped and she improved. She has remained in normal sinus rhythm on amiodarone. She has a past history of marked weakness secondary to potassium of 2.6 secondary to hydrochlorothiazide.  Since last visit she has complained of increased fatigue .  She has had unexplained weight loss.  Her blood work in May was normal.  She complains of malaise.  Some of these symptoms may be secondary to amiodarone. Echocardiogram on 11/08/13 showed normal prosthetic aortic valve function with peak gradient of 30 and a mean gradient of 18, normal left ventricular ejection fraction of 60-65% and grade 1 diastolic dysfunction. Lexa scan Myoview stress test on 11/09/13 showed no  evidence of ischemia   Current Outpatient Prescriptions  Medication Sig Dispense Refill  . allopurinol (ZYLOPRIM) 100 MG tablet Take 1 tablet (100 mg total) by mouth daily.  30 tablet  5  . amiodarone (PACERONE) 200 MG tablet TAKE 1 TABLET (200 MG TOTAL) BY MOUTH DAILY.  30 tablet  6  . amLODipine (NORVASC) 10 MG tablet Take 1 tablet (10 mg total) by mouth daily.  90 tablet  3  . aspirin 81 MG tablet Take 81 mg by mouth daily.        Marland Kitchen atorvastatin (LIPITOR) 40 MG tablet TAKE 1 TABLET (40 MG TOTAL) BY MOUTH DAILY.  30 tablet  6  . Calcium Carbonate-Vit D-Min (CALCIUM 1200 PO) Take by mouth daily.        . clotrimazole-betamethasone (LOTRISONE) cream Apply topically as needed.      . colchicine 0.6 MG tablet 1 to 2 tablets one to two times a day as needed for acute gout flare up  30 tablet  5  . dextromethorphan (DELSYM) 30 MG/5ML liquid Take 60 mg by mouth as needed.      . diptheria-tetanus toxoids (DECAVAC) 2-2 LF/0.5ML injection       . famotidine (PEPCID) 20 MG tablet TAKE 1 TABLET (20 MG TOTAL) BY MOUTH 2 (TWO) TIMES DAILY.  60 tablet  1  . fluticasone (FLONASE) 50 MCG/ACT nasal spray Place 1 spray into the nose daily as needed for rhinitis.       . furosemide (LASIX) 20 MG tablet Take  20 mg by mouth daily.       . hydrALAZINE (APRESOLINE) 25 MG tablet Take 1 tablet (25 mg total) by mouth 3 (three) times daily.  90 tablet  3  . potassium chloride SA (K-DUR,KLOR-CON) 10 MEQ tablet Take 1 tablet (10 mEq total) by mouth 3 (three) times daily.  270 tablet  3  . raloxifene (EVISTA) 60 MG tablet Take 60 mg by mouth daily.        . vitamin B-12 (CYANOCOBALAMIN) 1000 MCG tablet Take 1,000 mcg by mouth daily.      [name]. XARELTO 15 MG TABS tablet TAKE 1 TABLET (15 MG TOTAL) BY MOUTH DAILY WITH SUPPER.  30 tablet  6   No current facility-administered medications for this visit.    Allergies  Allergen Reactions  . Bystolic [Nebivolol Hcl]     Bradycardia.  . Lisinopril Cough  . Losartan      cough  . Metformin Nausea Only    nausea  . Metoprolol     Bradycardia and weakness  . Fluconazole Rash    Patient Active Problem List   Diagnosis Date Noted  . PAF (paroxysmal atrial fibrillation)     Priority: Medium  . Weight loss, unintentional 01/23/2014  . Gout 10/23/2013  . Malaise and fatigue 09/25/2012  . Cough 05/19/2012  . CKD (chronic kidney disease) stage 3, GFR 30-59 ml/min 05/19/2012  . Hemoptysis 05/19/2012  . Hypokalemia 05/19/2012  . Pulmonary nodule 05/19/2012  . Sinus bradycardia 05/19/2012  . Acute on chronic diastolic CHF (congestive heart failure), NYHA class 2 05/19/2012  . History of pulmonary embolus (PE)   . Iron deficiency anemia   . Vitamin B12 deficiency 10/11/2011  . Bronchitis 10/11/2011  . Hx of CABG 10/14/2010  . S/P aortic valve replacement with bioprosthetic valve 10/14/2010  . HTN (hypertension)   . Chronic anticoagulation   . CAD (coronary artery disease) 12/12/2008    History  Smoking status  . Never Smoker   Smokeless tobacco  . Never Used    History  Alcohol Use No    Family History  Problem Relation Age of Onset  . Stroke Mother   . Stroke Father   . Heart disease Brother   . Heart disease Brother     Review of Systems: Constitutional: no fever chills diaphoresis or fatigue or change in weight.  Head and neck: no hearing loss, no epistaxis, no photophobia or visual disturbance. Respiratory: No cough, shortness of breath or wheezing. Cardiovascular: No chest pain peripheral edema, palpitations. Gastrointestinal: No abdominal distention, no abdominal pain, no change in bowel habits hematochezia or melena. Genitourinary: No dysuria, no frequency, no urgency, no nocturia. Musculoskeletal:No arthralgias, no back pain, no gait disturbance or myalgias. Neurological: No dizziness, no headaches, no numbness, no seizures, no syncope, no weakness, no tremors. Hematologic: No lymphadenopathy, no easy bruising. Psychiatric: No  confusion, no hallucinations, no sleep disturbance.    Physical Exam: Filed Vitals:   01/23/14 0913  BP: 128/72  Pulse: 66   the general appearance reveals a well-developed well-nourished woman in no distress.The head and neck exam reveals pupils equal and reactive.  Extraocular movements are full.  There is no scleral icterus.  The mouth and pharynx are normal.  The neck is supple.  The carotids reveal no bruits.  The jugular venous pressure is normal.  The  thyroid is not enlarged.  There is no lymphadenopathy.  The chest is clear to percussion and auscultation.  There are no rales or rhonchi.  Expansion of the chest is symmetrical.  The precordium is quiet.  The first heart sound is normal.  The second heart sound is physiologically split.  There is no murmur gallop rub or click.  There is no abnormal lift or heave.  The abdomen is soft and nontender.  The bowel sounds are normal.  The liver and spleen are not enlarged.  There are no abdominal masses.  There are no abdominal bruits.  Extremities reveal good pedal pulses.  There is no phlebitis or edema.  There is no cyanosis or clubbing.  Strength is normal and symmetrical in all extremities.  There is no lateralizing weakness.  There are no sensory deficits.  The skin is warm and dry.  There is no rash.  EKG shows normal sinus rhythm with sinus arrhythmia and first degree AV block and nonspecific ST-T wave changes.   Assessment / Plan: 1.  Status post CABG and aortic valve replacement with a bioprosthetic aortic valve on 01/10/09 2. paroxysmal atrial fibrillation currently in normal sinus rhythm on amiodarone and Xarelto 3. malaise and fatigue 4.  Essential hypertension 5. unexplained weight loss without localizing signs or symptoms.  No anemia or GI complaints.  Recent chest x-ray normal.  Symptoms may be secondary to medication. Disposition: I suspect some of her malaise and fatigue is from the amiodarone.  She also complains of poor balance  which could be from amiodarone.  We will stop the amiodarone at this point.  We will decrease furosemide to 20 mg daily.  She will take K-Dur 10 milliequivalents 3 times a day. Recheck in 2 months for office visit EKG basal metabolic panel CBC and TSH

## 2014-01-23 NOTE — Assessment & Plan Note (Signed)
The patient is on Xarelto for her paroxysmal atrial fibrillation.  She is maintaining normal sinus rhythm on today's EKG.  He has not had any bleeding problems from the Xarelto

## 2014-02-05 ENCOUNTER — Other Ambulatory Visit: Payer: Self-pay | Admitting: Cardiology

## 2014-02-18 ENCOUNTER — Other Ambulatory Visit: Payer: Self-pay | Admitting: Cardiology

## 2014-04-08 ENCOUNTER — Other Ambulatory Visit: Payer: Medicare Other

## 2014-04-08 ENCOUNTER — Ambulatory Visit (INDEPENDENT_AMBULATORY_CARE_PROVIDER_SITE_OTHER): Payer: Medicare Other | Admitting: Cardiology

## 2014-04-08 VITALS — BP 120/70 | HR 100 | Ht 66.0 in | Wt 191.0 lb

## 2014-04-08 DIAGNOSIS — Z951 Presence of aortocoronary bypass graft: Secondary | ICD-10-CM

## 2014-04-08 DIAGNOSIS — I5033 Acute on chronic diastolic (congestive) heart failure: Secondary | ICD-10-CM

## 2014-04-08 DIAGNOSIS — I2583 Coronary atherosclerosis due to lipid rich plaque: Secondary | ICD-10-CM

## 2014-04-08 DIAGNOSIS — I2510 Atherosclerotic heart disease of native coronary artery without angina pectoris: Secondary | ICD-10-CM

## 2014-04-08 DIAGNOSIS — Z954 Presence of other heart-valve replacement: Secondary | ICD-10-CM

## 2014-04-08 DIAGNOSIS — I480 Paroxysmal atrial fibrillation: Secondary | ICD-10-CM

## 2014-04-08 DIAGNOSIS — Z953 Presence of xenogenic heart valve: Secondary | ICD-10-CM

## 2014-04-08 DIAGNOSIS — I119 Hypertensive heart disease without heart failure: Secondary | ICD-10-CM

## 2014-04-08 MED ORDER — AMIODARONE HCL 200 MG PO TABS
200.0000 mg | ORAL_TABLET | Freq: Two times a day (BID) | ORAL | Status: DC
Start: 2014-04-08 — End: 2014-08-07

## 2014-04-08 MED ORDER — AMIODARONE HCL 200 MG PO TABS: 200.0000 mg | ORAL_TABLET | Freq: Every day | ORAL | Status: DC

## 2014-04-08 MED ORDER — NITROGLYCERIN 0.4 MG SL SUBL: 0.4000 mg | SUBLINGUAL_TABLET | SUBLINGUAL | Status: AC | PRN

## 2014-04-08 NOTE — Assessment & Plan Note (Signed)
She is not having any evidence of volume overload.  She has continued to lose weight.  Her weight is down 5 pounds further from last visit.  She had extensive lab work at her PCP office last week but has not heard the results

## 2014-04-08 NOTE — Assessment & Plan Note (Signed)
She may be having some occasional angina pectoris later to her faster heart rate now that she is in atrial fibrillation.  We gave her a new prescription for sublingual nitroglycerin to try.  Her heart rate should respond to restarting amiodarone 200 mg twice a day .

## 2014-04-08 NOTE — Progress Notes (Signed)
Amber [name] Date of Birth:  07/12/1935 [facility] [address] [place], [place]  [address] [phone]        Fax   [phone]   History of Present Illness: This pleasant 78 year old woman is seen for a followup office visit. . She has a past history of ischemic heart disease and a past history of aortic stenosis. She underwent CABG and aortic valve replacement with a tissue aortic valve on 01/10/09.  Has a past history of paroxysmal atrial fibrillation.   She is on Xarelto 15 mg daily.  At her last visit she was in normal sinus rhythm.  She was having unexplained weight loss and malaise and fatigue and her amiodarone was stopped at her last visit.  Today she is back in atrial fibrillation.  Fortunately she is on long-term anticoagulation.  Her heart rate is 100.  She has had some occasional chest discomfort with radiation to the jaw.  This may represent angina pectoris. Echocardiogram on 11/08/13 showed normal prosthetic aortic valve function with peak gradient of 30 and a mean gradient of 18, normal left ventricular ejection fraction of 60-65% and grade 1 diastolic dysfunction. Lexa scan Myoview stress test on 11/09/13 showed no evidence of ischemia.   Current Outpatient Prescriptions  Medication Sig Dispense Refill  . allopurinol (ZYLOPRIM) 100 MG tablet Take 1 tablet (100 mg total) by mouth daily.  30 tablet  5  . amLODipine (NORVASC) 10 MG tablet Take 1 tablet (10 mg total) by mouth daily.  90 tablet  3  . aspirin 81 MG tablet Take 81 mg by mouth daily.        Marland Kitchen. atorvastatin (LIPITOR) 40 MG tablet TAKE 1 TABLET (40 MG TOTAL) BY MOUTH DAILY.  30 tablet  6  . Calcium Carbonate-Vit D-Min (CALCIUM 1200 PO) Take by mouth daily.        . clotrimazole-betamethasone (LOTRISONE) cream Apply topically as needed.      . colchicine 0.6 MG tablet 1 to 2 tablets one to two times a day as needed for acute gout flare up  30 tablet  5  . dextromethorphan (DELSYM) 30 MG/5ML  liquid Take 60 mg by mouth as needed.      . diptheria-tetanus toxoids (DECAVAC) 2-2 LF/0.5ML injection       . famotidine (PEPCID) 20 MG tablet TAKE 1 TABLET (20 MG TOTAL) BY MOUTH 2 (TWO) TIMES DAILY.  60 tablet  1  . fluticasone (FLONASE) 50 MCG/ACT nasal spray Place 1 spray into the nose daily as needed for rhinitis.       . furosemide (LASIX) 20 MG tablet Take 20 mg by mouth daily.       . hydrALAZINE (APRESOLINE) 25 MG tablet Take 1 tablet (25 mg total) by mouth 3 (three) times daily.  90 tablet  3  . potassium chloride SA (K-DUR,KLOR-CON) 10 MEQ tablet Take 1 tablet (10 mEq total) by mouth 3 (three) times daily.  270 tablet  3  . raloxifene (EVISTA) 60 MG tablet Take 60 mg by mouth daily.        . vitamin B-12 (CYANOCOBALAMIN) 1000 MCG tablet Take 1,000 mcg by mouth daily.      [name]. XARELTO 15 MG TABS tablet TAKE 1 TABLET (15 MG TOTAL) BY MOUTH DAILY WITH SUPPER.  30 tablet  1  . amiodarone (PACERONE) 200 MG tablet Take 1 tablet (200 mg total) by mouth 2 (two) times daily.  60 tablet  5  .  nitroGLYCERIN (NITROSTAT) 0.4 MG SL tablet Place 1 tablet (0.4 mg total) under the tongue every 5 (five) minutes as needed for chest pain.  25 tablet  PRN   No current facility-administered medications for this visit.    Allergies  Allergen Reactions  . Bystolic [Nebivolol Hcl]     Bradycardia.  . Lisinopril Cough  . Losartan     cough  . Metformin Nausea Only    nausea  . Metoprolol     Bradycardia and weakness  . Fluconazole Rash    Patient Active Problem List   Diagnosis Date Noted  . PAF (paroxysmal atrial fibrillation)     Priority: Medium  . Weight loss, unintentional 01/23/2014  . Gout 10/23/2013  . Malaise and fatigue 09/25/2012  . Cough 05/19/2012  . CKD (chronic kidney disease) stage 3, GFR 30-59 ml/min 05/19/2012  . Hemoptysis 05/19/2012  . Hypokalemia 05/19/2012  . Pulmonary nodule 05/19/2012  . Sinus bradycardia 05/19/2012  . Acute on chronic diastolic CHF (congestive  heart failure), NYHA class 2 05/19/2012  . History of pulmonary embolus (PE)   . Iron deficiency anemia   . Vitamin B12 deficiency 10/11/2011  . Bronchitis 10/11/2011  . Hx of CABG 10/14/2010  . S/P aortic valve replacement with bioprosthetic valve 10/14/2010  . HTN (hypertension)   . Chronic anticoagulation   . CAD (coronary artery disease) 12/12/2008    History  Smoking status  . Never Smoker   Smokeless tobacco  . Never Used    History  Alcohol Use No    Family History  Problem Relation Age of Onset  . Stroke Mother   . Stroke Father   . Heart disease Brother   . Heart disease Brother     Review of Systems: Constitutional: no fever chills diaphoresis or fatigue or change in weight.  Head and neck: no hearing loss, no epistaxis, no photophobia or visual disturbance. Respiratory: No cough, shortness of breath or wheezing. Cardiovascular: No chest pain peripheral edema, palpitations. Gastrointestinal: No abdominal distention, no abdominal pain, no change in bowel habits hematochezia or melena. Genitourinary: No dysuria, no frequency, no urgency, no nocturia. Musculoskeletal:No arthralgias, no back pain, no gait disturbance or myalgias. Neurological: No dizziness, no headaches, no numbness, no seizures, no syncope, no weakness, no tremors. Hematologic: No lymphadenopathy, no easy bruising. Psychiatric: No confusion, no hallucinations, no sleep disturbance.    Physical Exam: Filed Vitals:   04/08/14 1645  BP: 120/70  Pulse: 100   the general appearance reveals a well-developed well-nourished woman in no distress.The head and neck exam reveals pupils equal and reactive.  Extraocular movements are full.  There is no scleral icterus.  The mouth and pharynx are normal.  The neck is supple.  The carotids reveal no bruits.  The jugular venous pressure is normal.  The  thyroid is not enlarged.  There is no lymphadenopathy.  The chest is clear to percussion and auscultation.   There are no rales or rhonchi.  Expansion of the chest is symmetrical.  The pulse is irregularly irregular.  The precordium is quiet.  The first heart sound is normal.  The second heart sound is physiologically split.  There is no murmur gallop rub or click.  There is no abnormal lift or heave.  The abdomen is soft and nontender.  The bowel sounds are normal.  The liver and spleen are not enlarged.  There are no abdominal masses.  There are no abdominal bruits.  Extremities reveal good pedal pulses.  There  is no phlebitis or edema.  There is no cyanosis or clubbing.  Strength is normal and symmetrical in all extremities.  There is no lateralizing weakness.  There are no sensory deficits.  The skin is warm and dry.  There is no rash.  EKG shows atrial fibrillation with a ventricular response of 100 and nonspecific ST-T wave changes.   Assessment / Plan: 1.  Status post CABG and aortic valve replacement with a bioprosthetic aortic valve on 01/10/09 2. paroxysmal atrial fibrillation currently back in atrial fibrillation since stopping amiodarone.  She has remained on Xarelto 3. malaise and fatigue 4.  Essential hypertension 5. unexplained weight loss without localizing signs or symptoms.  No anemia or GI complaints.  Recent chest x-ray normal.  Symptoms may be secondary to medication. Disposition: Restart amiodarone.  Use sublingual nitroglycerin if chest discomfort recurs.  Lexa scan 5 months ago showed no ischemia.

## 2014-04-08 NOTE — Assessment & Plan Note (Signed)
She is in atrial fibrillation.  We will restart her on amiodarone 200 mg twice a day.  I talked to her about possible elective cardioversion if she remains in atrial fibrillation with symptoms.  We will plan to see her in one month for followup office visit EKG CBC TSH and complete metabolic panel.

## 2014-04-08 NOTE — Patient Instructions (Addendum)
RESTART AMIODARONE 200 MG TWICE A DAY, RX SENT TO PHARMACY   Your physician recommends that you schedule a follow-up appointment in: 1 MONTH OV/CBC/TSH/CMET/EKG WITH  Dr. Patty SermonsBrackbill OR PA/NP ON A DAY [facility]

## 2014-04-10 ENCOUNTER — Other Ambulatory Visit: Payer: Self-pay | Admitting: Cardiology

## 2014-04-12 ENCOUNTER — Other Ambulatory Visit: Payer: Self-pay | Admitting: Cardiology

## 2014-04-16 ENCOUNTER — Other Ambulatory Visit: Payer: Self-pay | Admitting: Cardiology

## 2014-05-06 ENCOUNTER — Encounter: Payer: Self-pay | Admitting: Nurse Practitioner

## 2014-05-06 ENCOUNTER — Ambulatory Visit (INDEPENDENT_AMBULATORY_CARE_PROVIDER_SITE_OTHER): Payer: Medicare Other | Admitting: Nurse Practitioner

## 2014-05-06 VITALS — BP 130/56 | HR 65 | Ht 65.0 in | Wt 193.1 lb

## 2014-05-06 DIAGNOSIS — I5033 Acute on chronic diastolic (congestive) heart failure: Secondary | ICD-10-CM

## 2014-05-06 DIAGNOSIS — Z79899 Other long term (current) drug therapy: Secondary | ICD-10-CM

## 2014-05-06 DIAGNOSIS — I480 Paroxysmal atrial fibrillation: Secondary | ICD-10-CM

## 2014-05-06 LAB — BASIC METABOLIC PANEL
BUN: 22 mg/dL (ref 6–23)
CO2: 24 mEq/L (ref 19–32)
Calcium: 9.1 mg/dL (ref 8.4–10.5)
Chloride: 105 mEq/L (ref 96–112)
Creatinine, Ser: 1.3 mg/dL — ABNORMAL HIGH (ref 0.4–1.2)
GFR: 42.7 mL/min — ABNORMAL LOW (ref >60.00)
Glucose, Bld: 119 mg/dL — ABNORMAL HIGH (ref 70–99)
Potassium: 3.1 mEq/L — ABNORMAL LOW (ref 3.5–5.1)
Sodium: 141 mEq/L (ref 135–145)

## 2014-05-06 LAB — LIPID PANEL
Cholesterol: 166 mg/dL (ref 0–200)
HDL: 65.5 mg/dL (ref >39.00)
LDL Cholesterol: 81 mg/dL (ref 0–99)
NonHDL: 100.5
Total CHOL/HDL Ratio: 3
Triglycerides: 99 mg/dL (ref 0.0–149.0)
VLDL: 19.8 mg/dL (ref 0.0–40.0)

## 2014-05-06 LAB — HEPATIC FUNCTION PANEL
ALT: 15 U/L (ref 0–35)
AST: 20 U/L (ref 0–37)
Albumin: 3.7 g/dL (ref 3.5–5.2)
Alkaline Phosphatase: 133 U/L — ABNORMAL HIGH (ref 39–117)
Bilirubin, Direct: 0.1 mg/dL (ref 0.0–0.3)
Total Bilirubin: 0.9 mg/dL (ref 0.2–1.2)
Total Protein: 7.4 g/dL (ref 6.0–8.3)

## 2014-05-06 LAB — CBC
HCT: 37.7 % (ref 36.0–46.0)
Hemoglobin: 11.8 g/dL — ABNORMAL LOW (ref 12.0–15.0)
MCHC: 31.3 g/dL (ref 30.0–36.0)
MCV: 81.2 fl (ref 78.0–100.0)
Platelets: 349 10*3/uL (ref 150.0–400.0)
RBC: 4.65 Mil/uL (ref 3.87–5.11)
RDW: 16.5 % — ABNORMAL HIGH (ref 11.5–15.5)
WBC: 8.5 10*3/uL (ref 4.0–10.5)

## 2014-05-06 LAB — TSH: TSH: 2.46 u[IU]/mL (ref 0.35–4.50)

## 2014-05-06 NOTE — Patient Instructions (Addendum)
We will be checking the following labs today BMET, CBC, TSH, Lipids, HPF  Stay on your current medicines  You are back in a normal rhythm today. No need for a cardioversion.  See Dr. Patty SermonsBrackbill in 3 months.  Call the [facility] office at [phone] if you have any questions, problems or concerns.

## 2014-05-06 NOTE — Progress Notes (Addendum)
Amber [name] Date of Birth: [birth year] Medical Record #[id number]  History of Present Illness: Amber [name] is seen back today for a follow up visit. This is an approximate one month check. She is seen for Dr. Patty Sermons. She has a history of CAD with prior CABG in 2010, s/p AVR with a tissue valve in 2010, inferior MI in September of 2011, PAF - on amiodarone, iron deficiency anemia, HTN, type 2 DM, HLD, carotid disease, GERD, and on chronic anticoagulation. She has been taken off of her beta blocker in the past due to bradycardia. Echocardiogram on 11/08/13 showed normal prosthetic aortic valve function with peak gradient of 30 and a mean gradient of 18, normal left ventricular ejection fraction of 60-65% and grade 1 diastolic dysfunction. Lexa scan Myoview stress test on 11/09/13 showed no evidence of ischemia  She was seen here back in August - she was not feeling well - having malaise and fatigue - amiodarone was stopped. Lasix was cut back as well.   Seen back in October for follow up- was continuing to lose weight. She was back in AF. Amiodarone was restarted. She remains on her Xarelto. Discussed possible need for cardioversion.   Comes back today. Here alone. Doing ok. She does not really seem to be too aware of her AF. She is back in sinus today. Breathing is ok. No chest pain. Not very active. Not dizzy or lightheaded. She is fasting today. She is happy that she is back in sinus but really not able to say when or if she felt any better. Weight is up 2 pounds.   Current Outpatient Prescriptions  Medication Sig Dispense Refill  . allopurinol (ZYLOPRIM) 100 MG tablet Take 1 tablet (100 mg total) by mouth daily. 30 tablet 5  . amiodarone (PACERONE) 200 MG tablet Take 1 tablet (200 mg total) by mouth 2 (two) times daily. 60 tablet 5  . amLODipine (NORVASC) 10 MG tablet Take 1 tablet (10 mg total) by mouth daily. 90 tablet 3  . aspirin 81 MG tablet Take 81 mg by mouth daily.      Marland Kitchen atorvastatin  (LIPITOR) 40 MG tablet TAKE 1 TABLET (40 MG TOTAL) BY MOUTH DAILY. 30 tablet 6  . Calcium Carbonate-Vit D-Min (CALCIUM 1200 PO) Take by mouth daily.      . clotrimazole-betamethasone (LOTRISONE) cream Apply topically as needed.    . colchicine 0.6 MG tablet 1 to 2 tablets one to two times a day as needed for acute gout flare up 30 tablet 5  . dextromethorphan (DELSYM) 30 MG/5ML liquid Take 60 mg by mouth as needed.    . diptheria-tetanus toxoids (DECAVAC) 2-2 LF/0.5ML injection     . famotidine (PEPCID) 20 MG tablet TAKE 1 TABLET (20 MG TOTAL) BY MOUTH 2 (TWO) TIMES DAILY. 60 tablet 4  . fluticasone (FLONASE) 50 MCG/ACT nasal spray Place 1 spray into the nose daily as needed for rhinitis.     . furosemide (LASIX) 20 MG tablet Take 20 mg by mouth daily.     . hydrALAZINE (APRESOLINE) 25 MG tablet Take 1 tablet (25 mg total) by mouth 3 (three) times daily. 90 tablet 3  . nitroGLYCERIN (NITROSTAT) 0.4 MG SL tablet Place 1 tablet (0.4 mg total) under the tongue every 5 (five) minutes as needed for chest pain. 25 tablet PRN  . potassium chloride SA (K-DUR,KLOR-CON) 10 MEQ tablet Take 1 tablet (10 mEq total) by mouth 3 (three) times daily. 270 tablet 3  . promethazine (PHENERGAN) 25  MG tablet Take 25 mg by mouth as needed.   3  . raloxifene (EVISTA) 60 MG tablet Take 60 mg by mouth daily.      . vitamin B-12 (CYANOCOBALAMIN) 1000 MCG tablet Take 1,000 mcg by mouth daily.    [name] 15 MG TABS tablet TAKE 1 TABLET (15 MG TOTAL) BY MOUTH DAILY WITH SUPPER. 30 tablet 5   No current facility-administered medications for this visit.    Allergies  Allergen Reactions  . Bystolic [Nebivolol Hcl]     Bradycardia.  . Lisinopril Cough  . Losartan     cough  . Metformin Nausea Only    nausea  . Metoprolol     Bradycardia and weakness  . Fluconazole Rash    Past Medical History  Diagnosis Date  . CAD (coronary artery disease) July 2010    s/p CABG  . S/P AVR (aortic valve replacement) July 2010    . 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 PAF (paroxysmal atrial fibrillation)   . Iron deficiency anemia   . HTN (hypertension)   . Diabetes mellitus type 2, diet-controlled   . Hyperlipidemia   . DJD (degenerative joint disease)   . Carotid artery disease   . GERD (gastroesophageal reflux disease)   . Esophagitis     s/p esophageal dilatation  . Pernicious anemia     on B12 injections  . Chronic anticoagulation   . Shoulder fracture, left September 2011  . Fracture     of left humerus   . Nonsustained ventricular tachycardia   . DJD (degenerative joint disease)   . Shortness of breath   . Orthopnea      She reports 2-3-pillow orthopnea  . Lower extremity edema     bilateral   . Dizziness     developed intermittent episodes of dizziness  . Chest tightness or pressure   . Aortic stenosis   . Osteopenia   . Iron deficiency anemia   . Vitamin B12 deficiency   . Dyslipidemia   . Hiatal hernia   . History of pulmonary embolus (PE) 15 years ago    Past Surgical History  Procedure Laterality Date  . Coronary artery bypass graft  12/2008    CABG x 1; Dr. [name]  . Aortic valve replacement  12/2008  . Cardiac catheterization  02/2010  . Aspiration thrombectomy  07/2009  . Other surgical history      Total hysterectomy  . Cholecystectomy      History  Smoking status  . Never Smoker   Smokeless tobacco  . Never Used    History  Alcohol Use No    Family History  Problem Relation Age of Onset  . Stroke Mother   . Stroke Father   . Heart disease Brother   . Heart disease Brother     Review of Systems: The review of systems is per the HPI.  All other systems were reviewed and are negative.  Physical Exam: BP 130/56 mmHg  Pulse 65  Ht 5\' 5"  (1.651 m)  Wt 193 lb 1.9 oz (87.599 kg)  BMI 32.14 kg/m2 Patient is very pleasant and in no acute distress. Weight up 2 pounds. Skin is warm and dry. Color is normal.  HEENT is unremarkable.  Normocephalic/atraumatic. PERRL. Sclera are nonicteric. Neck is supple. No masses. No JVD. Lungs are clear. Cardiac exam shows a regular rate and rhythm. Occasional ectopic. Soft outflow murmur noted. Abdomen is soft. Extremities  are without edema. Gait and ROM are intact. No gross neurologic deficits noted.  Wt Readings from Last 3 Encounters:  05/06/14 193 lb 1.9 oz (87.599 kg)  04/08/14 191 lb (86.637 kg)  01/23/14 196 lb 1.9 oz (88.959 kg)    LABORATORY DATA/PROCEDURES: Her EKG today shows sinus rhythm with 1st degree AV block  Lab Results  Component Value Date   WBC 12.0* 10/23/2013   HGB 12.8 10/23/2013   HCT 38.7 10/23/2013   PLT 342.0 10/23/2013   GLUCOSE 103* 10/23/2013   CHOL 171 08/02/2013   TRIG 116.0 08/02/2013   HDL 63.60 08/02/2013   LDLCALC 84 08/02/2013   ALT 15 10/23/2013   AST 23 10/23/2013   NA 138 10/23/2013   K 3.7 10/23/2013   CL 102 10/23/2013   CREATININE 1.5* 10/23/2013   BUN 23 10/23/2013   CO2 26 10/23/2013   TSH 2.82 10/23/2013   INR 2.54* 11/11/2012   HGBA1C * 02/26/2010    6.2 (NOTE)                                                                       According to the ADA Clinical Practice Recommendations for 2011, when HbA1c is used as a screening test:   >=6.5%   Diagnostic of Diabetes Mellitus           (if abnormal result  is confirmed)  5.7-6.4%   Increased risk of developing Diabetes Mellitus  References:Diagnosis and Classification of Diabetes Mellitus,Diabetes Care,2011,34(Suppl 1):S62-S69 and Standards of Medical Care in         Diabetes - 2011,Diabetes Care,2011,34  (Suppl 1):S11-S61.    BNP (last 3 results) No results for input(s): PROBNP in the last 8760 hours.  Myoview Impression from May 2015 Exercise Capacity: Lexiscan with no exercise. BP Response: Hypotensive blood pressure response during Lexiscan infusion. Clinical Symptoms: No symptoms. ECG Impression: No significant ST segment change suggestive of  ischemia. Comparison with Prior Nuclear Study: No images to compare  Overall Impression: Low risk stress nuclear study No evidence of ischemia. Study was not gated due to PVC's. If patient has not had an echo would consider to evaluate EF.  LV Ejection Fraction: Study not gated. LV Wall Motion: NA  Signed: Armanda Magicraci Turner, MD [facility]   Echo Sudy Conclusions from May 2015  - Left ventricle: The cavity size was normal. Wall thickness was normal. Systolic function was normal. The estimated ejection fraction was in the range of 60% to 65%. Wall motion was normal; there were no regional wall motion abnormalities. Doppler parameters are consistent with abnormal left ventricular relaxation (grade 1 diastolic dysfunction). The E/e&' ratio is >15, suggesting normal LV filling pressure. - Aortic valve: Bioprosthetic aortic valve, well-seated. Peak and mean gradients of 30 and 18 mmHg, respectively. Trivial regurgitation. - Mitral valve: Calcified annulus. - Left atrium: Moderately dilated (40 ml/m2). - Right ventricle: The cavity size was mildly dilated. - Atrial septum: No defect or patent foramen ovale was identified. - Tricuspid valve: There was mild regurgitation. - Pulmonary arteries: PA peak pressure: 33 mm Hg (S).  Impressions:  - LVEF 60-65%, diastolic dysfunction with elevated LV filling pressure. Moderately dilated LA. Bioprosthetic aortic valve, peak and mean gradients of 30 mmHg and 18  mmHg, respectively.   Assessment / Plan:  1. Status post CABG and aortic valve replacement with a bioprosthetic aortic valve on 01/10/09  2. Paroxysmal atrial fibrillation - now back on amiodarone for recurrent PAF. She has remained on Xarelto -  She is back in sinus today. No need for cardioversion.   3. Malaise and fatigue -  Does not seem to really be an issue today.   4. Essential hypertension - BP stable today.   5. Unexplained weight loss without  localizing signs or symptoms. Her weight is up 2 pounds today.   6. Diastolic HF - compensated.  She is doing well. Stay on current medicines. Check follow up labs today. See Dr. Patty SermonsBrackbill in 3 months.   Patient is agreeable to this plan and will call if any problems develop in the interim.   Rosalio MacadamiaLori C. Leonard Feigel, RN, ANP-C [facility] [address] [place], [place]  [id number] [phone]

## 2014-05-07 ENCOUNTER — Other Ambulatory Visit: Payer: Self-pay | Admitting: *Deleted

## 2014-05-07 DIAGNOSIS — E876 Hypokalemia: Secondary | ICD-10-CM

## 2014-05-07 MED ORDER — POTASSIUM CHLORIDE CRYS ER 20 MEQ PO TBCR: 20.0000 meq | EXTENDED_RELEASE_TABLET | Freq: Two times a day (BID) | ORAL | Status: DC

## 2014-05-14 ENCOUNTER — Other Ambulatory Visit (INDEPENDENT_AMBULATORY_CARE_PROVIDER_SITE_OTHER): Payer: Medicare Other | Admitting: *Deleted

## 2014-05-14 DIAGNOSIS — E876 Hypokalemia: Secondary | ICD-10-CM

## 2014-05-15 ENCOUNTER — Telehealth: Payer: Self-pay | Admitting: Nurse Practitioner

## 2014-05-15 LAB — BASIC METABOLIC PANEL
BUN: 18 mg/dL (ref 6–23)
CO2: 21 mEq/L (ref 19–32)
Calcium: 8.9 mg/dL (ref 8.4–10.5)
Chloride: 107 mEq/L (ref 96–112)
Creatinine, Ser: 1.3 mg/dL — ABNORMAL HIGH (ref 0.4–1.2)
GFR: 43.48 mL/min — ABNORMAL LOW (ref >60.00)
Glucose, Bld: 104 mg/dL — ABNORMAL HIGH (ref 70–99)
Potassium: 4.6 mEq/L (ref 3.5–5.1)
Sodium: 136 mEq/L (ref 135–145)

## 2014-05-15 NOTE — Telephone Encounter (Signed)
Follow Up   Pt returned call for lab report//sr

## 2014-05-17 NOTE — Telephone Encounter (Signed)
Follow up  ° ° ° °Returning call back to nurse  °

## 2014-05-17 NOTE — Telephone Encounter (Signed)
Advised patient of lab results  

## 2014-05-17 NOTE — Telephone Encounter (Signed)
-----   Message from Rosalio MacadamiaLori C Gerhardt, NP sent at 05/15/2014  3:22 PM EST ----- Ok to report. Labs are stable. Potassium better.

## 2014-05-31 IMAGING — CT CT ABD-PELV W/O CM
2 of 4 series · 13 of 36 positions shown, 19 images · non-contrast
Comparison: CT abdomen pelvis of 07/22/2006

CLINICAL DATA: Right flank pain radiating to the pelvis for 5 days,
history of kidney stones

EXAM:
CT ABDOMEN AND PELVIS WITHOUT CONTRAST
TECHNIQUE: Multidetector CT imaging of the abdomen and pelvis was performed
following the standard protocol without intravenous contrast.

[Series 601: coronal body · coronal · 0.87mm/px · 1 of 135 slices shown, 2 images]
[im 45/135  soft-tissue]
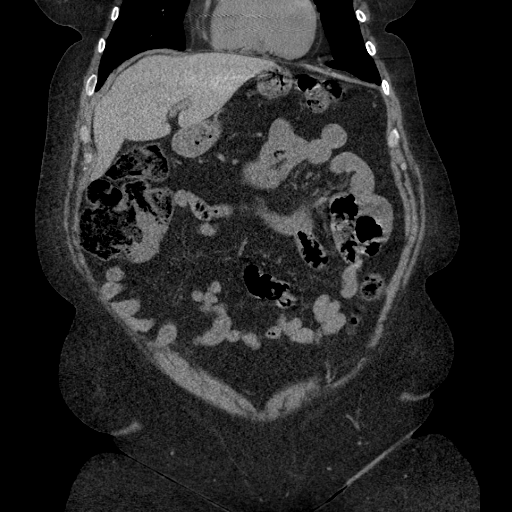
[im 45/135  bone]
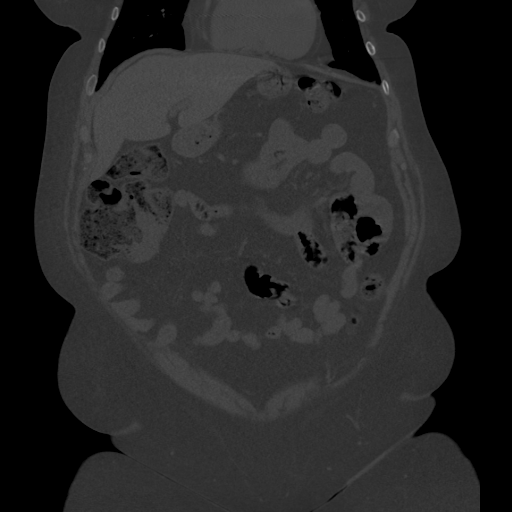

[Series 602: sagittal body · sagittal · 0.87mm/px · 12 of 161 slices shown, 17 images]
[im 9/161  soft-tissue]
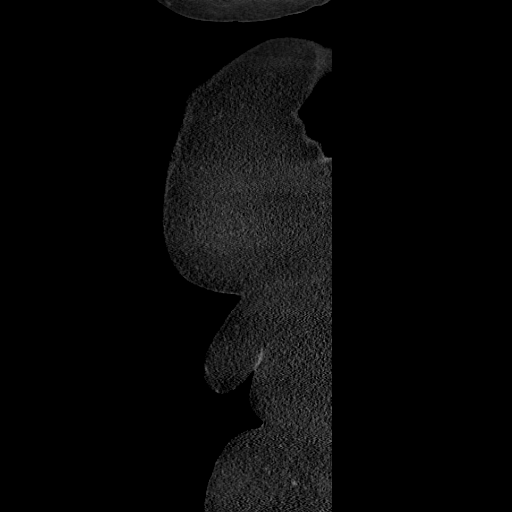
[im 9/161  lung]
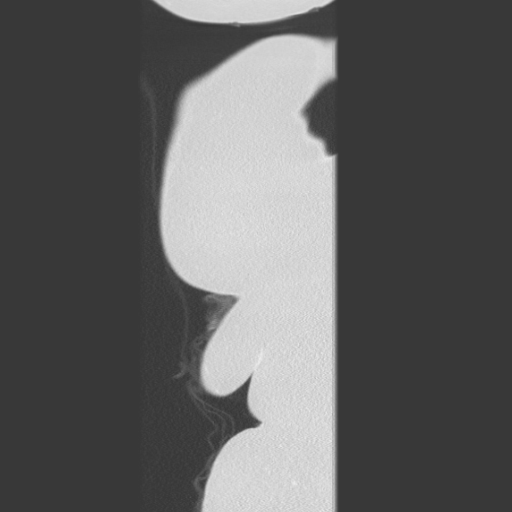
[im 9/161  bone]
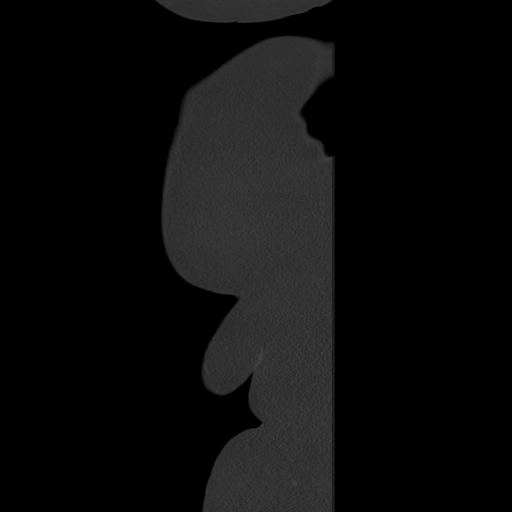
[im 18/161  lung]
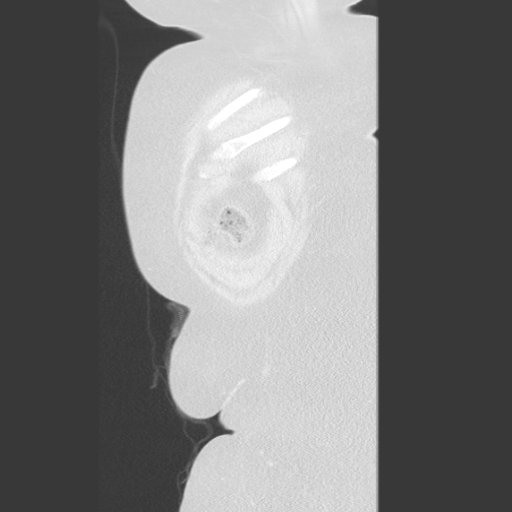
[im 27/161  soft-tissue]
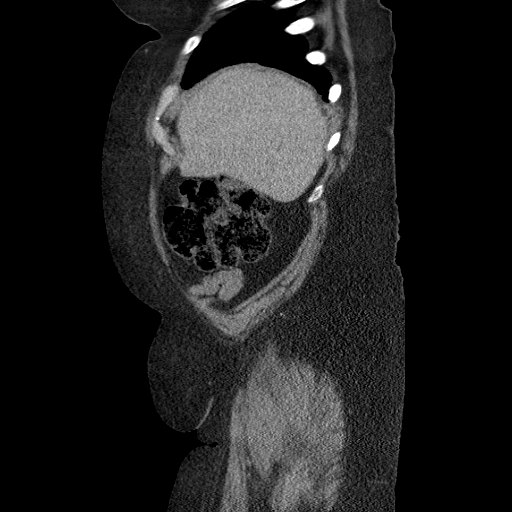
[im 27/161  lung]
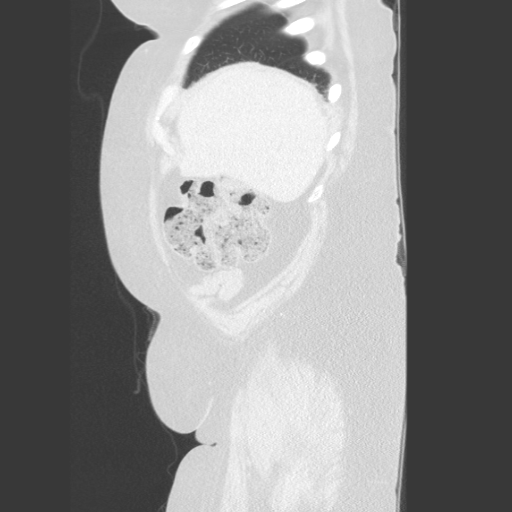
[im 36/161  soft-tissue]
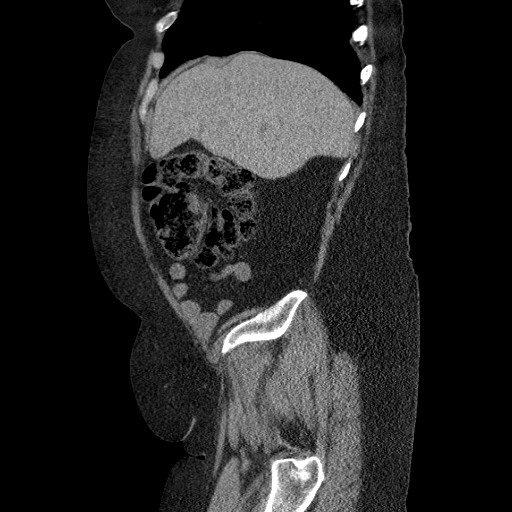
[im 36/161  lung]
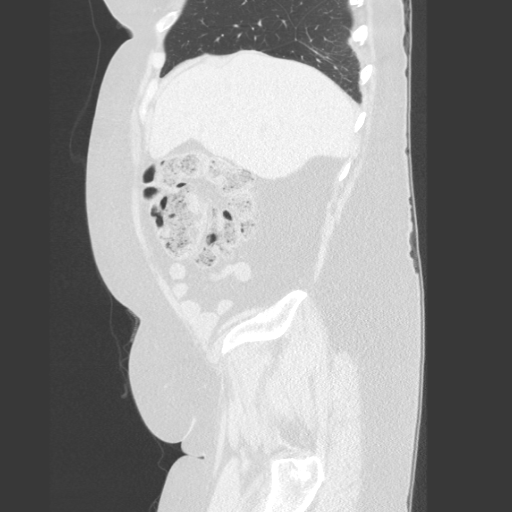
[im 54/161  soft-tissue]
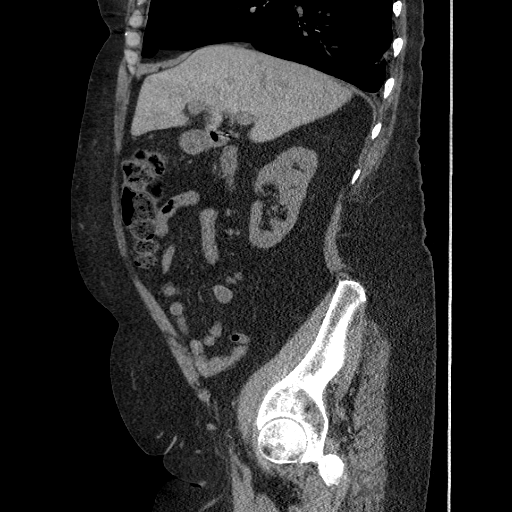
[im 63/161  soft-tissue]
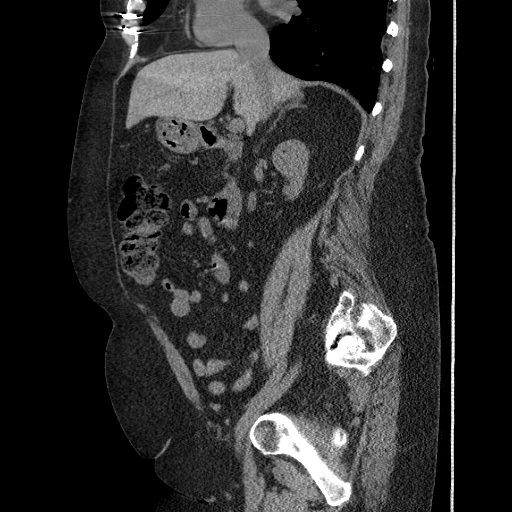
[im 81/161  soft-tissue]
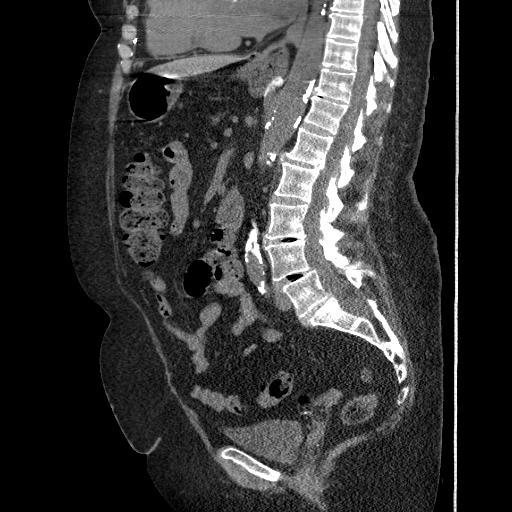
[im 98/161  soft-tissue]
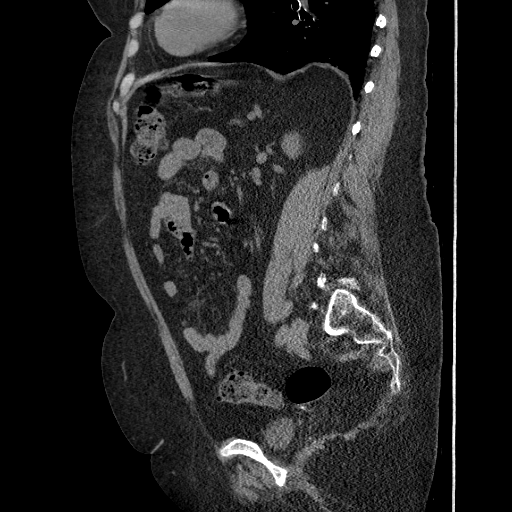
[im 107/161  soft-tissue]
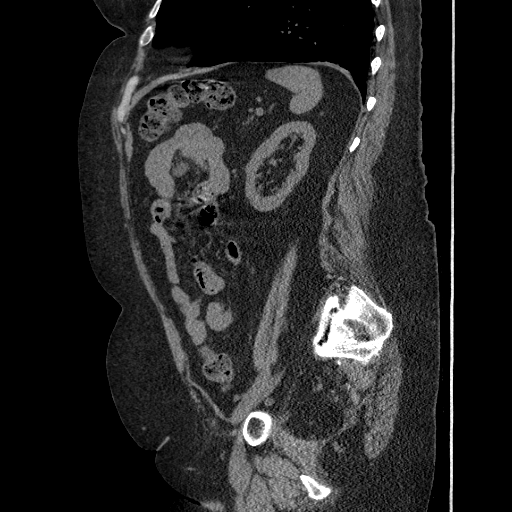
[im 125/161  soft-tissue]
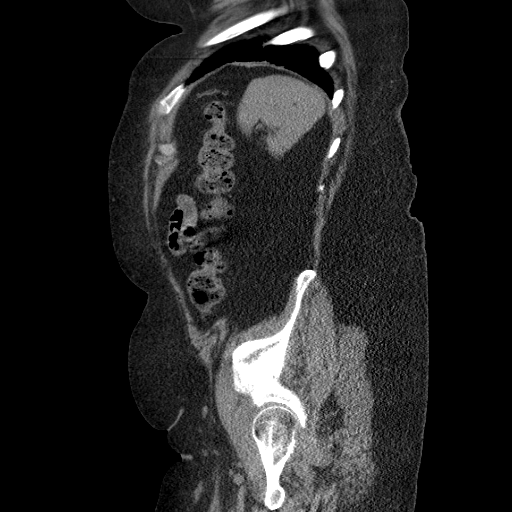
[im 125/161  bone]
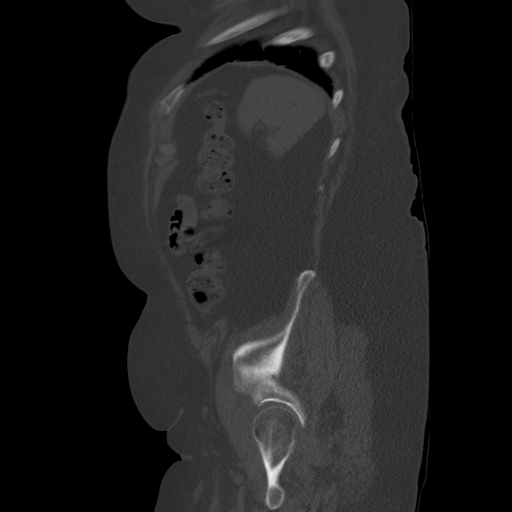
[im 134/161  soft-tissue]
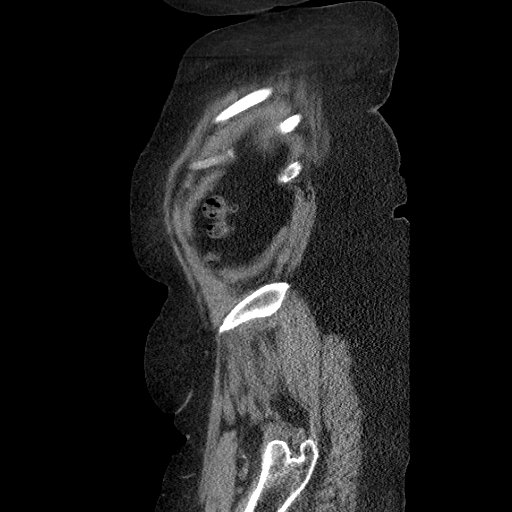
[im 152/161  soft-tissue]
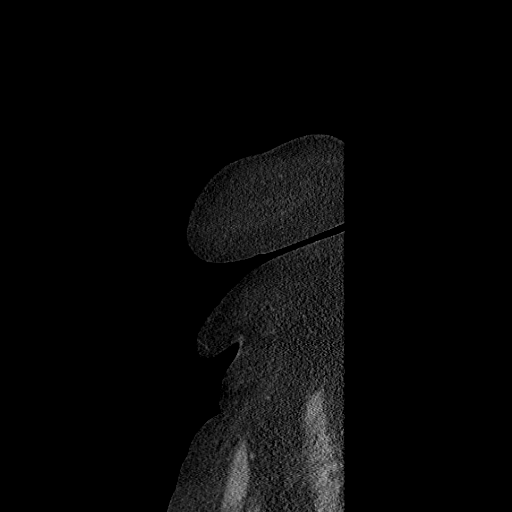

[13 of 36 positions shown; findings below may reference images not displayed]

FINDINGS: Linear scarring in the posterior right lower lobe appears stable. A
small to moderate size hiatal hernia remains. The liver is
unremarkable in the unenhanced state. Surgical clips are present
from prior cholecystectomy. The pancreas is fatty replaced and
atrophic. The adrenal glands and spleen are unremarkable. The
stomach is decompressed. No renal calculi are seen and there is no
evidence of hydronephrosis on this unenhanced study. There do appear
to be bilateral renal cysts present one of which is somewhat
hyperdense on the right. The proximal ureters are normal in caliber.
The abdominal aorta is normal in caliber with moderate atheromatous
change present. No adenopathy is seen.

The urinary bladder is unremarkable. The uterus has previously been
resected. No adnexal lesion is seen and no free fluid is noted
within the pelvis. The distal ureters are normal in caliber. There
are scattered rectosigmoid colonic diverticula but no diverticulitis
is seen. The terminal ileum is unremarkable, as is the appendix. The
lumbar vertebrae are in normal alignment. There is degenerative disc
disease at L3-4, L4-5 and L5-S1 levels.
IMPRESSION: 1. No explanation for the patient's right flank pain is seen. No
renal or ureteral calculi are noted and there is no evidence of
hydronephrosis.
2. The appendix and terminal ileum are unremarkable.
3. Scattered rectosigmoid colonic diverticula.
4. Small hiatal hernia.

## 2014-06-22 ENCOUNTER — Other Ambulatory Visit: Payer: Self-pay | Admitting: Cardiology

## 2014-07-24 ENCOUNTER — Other Ambulatory Visit: Payer: Self-pay | Admitting: Cardiology

## 2014-08-05 ENCOUNTER — Ambulatory Visit: Payer: Medicare Other | Admitting: Cardiology

## 2014-08-07 ENCOUNTER — Encounter: Payer: Self-pay | Admitting: Cardiology

## 2014-08-07 ENCOUNTER — Ambulatory Visit (INDEPENDENT_AMBULATORY_CARE_PROVIDER_SITE_OTHER): Payer: PPO | Admitting: Cardiology

## 2014-08-07 VITALS — BP 130/62 | HR 59 | Ht 65.0 in | Wt 190.1 lb

## 2014-08-07 DIAGNOSIS — I5033 Acute on chronic diastolic (congestive) heart failure: Secondary | ICD-10-CM

## 2014-08-07 DIAGNOSIS — I480 Paroxysmal atrial fibrillation: Secondary | ICD-10-CM

## 2014-08-07 DIAGNOSIS — Z954 Presence of other heart-valve replacement: Secondary | ICD-10-CM

## 2014-08-07 DIAGNOSIS — Z953 Presence of xenogenic heart valve: Secondary | ICD-10-CM

## 2014-08-07 DIAGNOSIS — Z951 Presence of aortocoronary bypass graft: Secondary | ICD-10-CM

## 2014-08-07 NOTE — Progress Notes (Signed)
Cardiology Office Note   Date:  08/07/2014   ID:  Amber MungoMary F Geffrard, DOB 09/23/1935, MRN [id number]  PCP:  Lolita PatellaEADE,ROBERT ALEXANDER, MD  Cardiologist:   Cassell Clementhomas Alex Leahy, MD   No chief complaint on file.     History of Present Illness: Amber [name] is a 79 y.o. female who presents for follow-up office visit  This pleasant 79 year old woman is seen for a followup office visit. . She has a past history of ischemic heart disease and a past history of aortic stenosis. She underwent CABG and aortic valve replacement with a tissue aortic valve on 01/10/09. Has a past history of paroxysmal atrial fibrillation. She is on Xarelto 15 mg daily. At her last visit she was in normal sinus rhythm. At the previous visit she was in atrial fibrillation and her amiodarone was restarted in a dose of 200 mg twice a day.  When she returned for her subsequent visit she was back in sinus rhythm and today she is still in sinus rhythm. Echocardiogram on 11/08/13 showed normal prosthetic aortic valve function with peak gradient of 30 and a mean gradient of 18, normal left ventricular ejection fraction of 60-65% and grade 1 diastolic dysfunction. Lexa scan Myoview stress test on 11/09/13 showed no evidence of ischemia. She has been having some mild dizziness and mild lethargy.  She has not been using any recent chest pain.  She is watching her diet better and her weight is down 3 pounds.  She has not had any recurrent attacks of gout since starting allopurinol.  Past Medical History  Diagnosis Date  . CAD (coronary artery disease) July 2010    s/p CABG  . S/P AVR (aortic valve replacement) July 2010  . 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 PAF (paroxysmal atrial fibrillation)   . Iron deficiency anemia   . HTN (hypertension)   . Diabetes mellitus type 2, diet-controlled   . Hyperlipidemia   . DJD (degenerative joint disease)   . Carotid artery disease   . GERD (gastroesophageal  reflux disease)   . Esophagitis     s/p esophageal dilatation  . Pernicious anemia     on B12 injections  . Chronic anticoagulation   . Shoulder fracture, left September 2011  . Fracture     of left humerus   . Nonsustained ventricular tachycardia   . DJD (degenerative joint disease)   . Shortness of breath   . Orthopnea      She reports 2-3-pillow orthopnea  . Lower extremity edema     bilateral   . Dizziness     developed intermittent episodes of dizziness  . Chest tightness or pressure   . Aortic stenosis   . Osteopenia   . Iron deficiency anemia   . Vitamin B12 deficiency   . Dyslipidemia   . Hiatal hernia   . History of pulmonary embolus (PE) 15 years ago    Past Surgical History  Procedure Laterality Date  . Coronary artery bypass graft  12/2008    CABG x 1; Dr. Cornelius Moraswen  . Aortic valve replacement  12/2008  . Cardiac catheterization  02/2010  . Aspiration thrombectomy  07/2009  . Other surgical history      Total hysterectomy  . Cholecystectomy       Current Outpatient Prescriptions  Medication Sig Dispense Refill  . allopurinol (ZYLOPRIM) 100 MG tablet TAKE 1 TABLET (100 MG TOTAL) BY MOUTH DAILY. 30 tablet 3  .  amiodarone (PACERONE) 200 MG tablet Take 200 mg by mouth daily.    Marland Kitchen amLODipine (NORVASC) 10 MG tablet Take 1 tablet (10 mg total) by mouth daily. 90 tablet 3  . aspirin 81 MG tablet Take 81 mg by mouth daily.      Marland Kitchen atorvastatin (LIPITOR) 40 MG tablet TAKE 1 TABLET BY MOUTH DAILY. 30 tablet 6  . Calcium Carbonate-Vit D-Min (CALCIUM 1200 PO) Take by mouth daily.      . clotrimazole-betamethasone (LOTRISONE) cream Apply topically as needed.    . colchicine 0.6 MG tablet 1 to 2 tablets one to two times a day as needed for acute gout flare up 30 tablet 5  . dextromethorphan (DELSYM) 30 MG/5ML liquid Take 60 mg by mouth as needed.    . diptheria-tetanus toxoids (DECAVAC) 2-2 LF/0.5ML injection     . famotidine (PEPCID) 20 MG tablet TAKE 1 TABLET (20 MG TOTAL)  BY MOUTH 2 (TWO) TIMES DAILY. 60 tablet 4  . fluticasone (FLONASE) 50 MCG/ACT nasal spray Place 1 spray into the nose daily as needed for rhinitis.     . furosemide (LASIX) 40 MG tablet Take 40 mg by mouth daily.  3  . hydrALAZINE (APRESOLINE) 25 MG tablet Take 1 tablet (25 mg total) by mouth 3 (three) times daily. 90 tablet 3  . nitroGLYCERIN (NITROSTAT) 0.4 MG SL tablet Place 1 tablet (0.4 mg total) under the tongue every 5 (five) minutes as needed for chest pain. 25 tablet PRN  . potassium chloride (K-DUR,KLOR-CON) 20 MEQ tablet Take 1 tablet (20 mEq total) by mouth 2 (two) times daily. 60 tablet 3  . promethazine (PHENERGAN) 25 MG tablet Take 25 mg by mouth as needed.   3  . raloxifene (EVISTA) 60 MG tablet Take 60 mg by mouth daily.      . vitamin B-12 (CYANOCOBALAMIN) 1000 MCG tablet Take 1,000 mcg by mouth daily.    [name] 15 MG TABS tablet TAKE 1 TABLET (15 MG TOTAL) BY MOUTH DAILY WITH SUPPER. 30 tablet 5   No current facility-administered medications for this visit.    Allergies:   Bystolic; Lisinopril; Losartan; Metformin; Metoprolol; and Fluconazole    Social History:  The patient  reports that she has never smoked. She has never used smokeless tobacco. She reports that she does not drink alcohol or use illicit drugs.   Family History:  The patient's family history includes Heart disease in her brother and brother; Stroke in her father and mother.    ROS:  Please see the history of present illness.   Otherwise, review of systems are positive for none.   All other systems are reviewed and negative.    PHYSICAL EXAM: VS:  BP 130/62 mmHg  Pulse 59  Ht  (1.651 m)  Wt 190 lb 1.9 oz (86.238 kg)  BMI 31.64 kg/m2 , BMI Body mass index is 31.64 kg/(m^2). GEN: Well nourished, well developed, in no acute distress HEENT: normal Neck: no JVD, carotid bruits, or masses Cardiac: RRR; there is a grade 2/6 systolic flow murmur across the aortic valve prosthesis.  No diastolic  murmur.  No gallop.  No significant peripheral edema. Respiratory:  clear to auscultation bilaterally, normal work of breathing GI: soft, nontender, nondistended, + BS MS: no deformity or atrophy Skin: warm and dry, no rash Neuro:  Strength and sensation are intact Psych: euthymic mood, full affect   EKG:  EKG is not ordered today.    Recent Labs: 05/06/2014: ALT 15; Hemoglobin  11.8*; Platelets 349.0; TSH 2.46 05/14/2014: BUN 18; Creatinine 1.3*; Potassium 4.6; Sodium 136    Lipid Panel    Component Value Date/Time   CHOL 166 05/06/2014 0949   TRIG 99.0 05/06/2014 0949   HDL 65.50 05/06/2014 0949   CHOLHDL 3 05/06/2014 0949   VLDL 19.8 05/06/2014 0949   LDLCALC 81 05/06/2014 0949      Wt Readings from Last 3 Encounters:  08/07/14 190 lb 1.9 oz (86.238 kg)  05/06/14 193 lb 1.9 oz (87.599 kg)  04/08/14 191 lb (86.637 kg)         ASSESSMENT AND PLAN:  1. Status post CABG and aortic valve replacement with a bioprosthetic aortic valve on 01/10/09 2. paroxysmal atrial fibrillation currently holding normal sinus rhythm on amiodarone.. She has remained on Xarelto 3. malaise and fatigue 4. Essential hypertension  Plan: We will decrease her amiodarone to just once a day.  She may stop her baby aspirin since she is also on full anticoagulation.   Current medicines are reviewed at length with the patient today.  The patient does not have concerns regarding medicines.  The following changes have been made:  no change    Orders Placed This Encounter  Procedures  . Basic metabolic panel  . TSH  . CBC with Differential/Platelet     Disposition:   FU with Dr. Patty Sermons in 3 months for office visit, EKG, CBC, TSH, and basal metabolic panel.  Her last chest x-ray was on 11/09/13   Signed, Cassell Clement, MD  08/07/2014 1:09 PM    [facility] [address] [place], [place], [place]  [address] Phone: [phone]; Fax: [phone]

## 2014-08-07 NOTE — Patient Instructions (Signed)
STOP YOUR ASPIRIN  DECREASE YOUR AMIODARONE TO ONCE DAILY   Your physician recommends that you schedule a follow-up appointment in: 3 month ov/ekg/bmet/tsh/cbc

## 2014-09-16 ENCOUNTER — Other Ambulatory Visit: Payer: Self-pay | Admitting: Gastroenterology

## 2014-09-16 ENCOUNTER — Other Ambulatory Visit: Payer: Self-pay | Admitting: Cardiology

## 2014-10-04 ENCOUNTER — Other Ambulatory Visit: Payer: Self-pay | Admitting: Cardiology

## 2014-10-15 ENCOUNTER — Other Ambulatory Visit (HOSPITAL_COMMUNITY): Payer: Self-pay | Admitting: Gastroenterology

## 2014-10-15 DIAGNOSIS — R1111 Vomiting without nausea: Secondary | ICD-10-CM

## 2014-10-31 ENCOUNTER — Ambulatory Visit (HOSPITAL_COMMUNITY)
Admission: RE | Admit: 2014-10-31 | Discharge: 2014-10-31 | Disposition: A | Discharge status: Home / Self Care | Payer: PPO | Source: Ambulatory Visit | Attending: Gastroenterology | Admitting: Gastroenterology

## 2014-10-31 DIAGNOSIS — R1111 Vomiting without nausea: Secondary | ICD-10-CM | POA: Insufficient documentation

## 2014-10-31 DIAGNOSIS — R6881 Early satiety: Secondary | ICD-10-CM | POA: Diagnosis not present

## 2014-10-31 MED ORDER — TECHNETIUM TC 99M SULFUR COLLOID
2.0000 | Freq: Once | INTRAVENOUS | Status: AC | PRN
  Administered 2014-10-31: 2 via ORAL

## 2014-11-05 ENCOUNTER — Other Ambulatory Visit: Payer: PPO

## 2014-11-05 ENCOUNTER — Encounter: Payer: Self-pay | Admitting: Cardiology

## 2014-11-05 ENCOUNTER — Ambulatory Visit (INDEPENDENT_AMBULATORY_CARE_PROVIDER_SITE_OTHER): Payer: PPO | Admitting: Cardiology

## 2014-11-05 VITALS — BP 150/54 | HR 59 | Ht 65.0 in | Wt 177.0 lb

## 2014-11-05 DIAGNOSIS — Z953 Presence of xenogenic heart valve: Secondary | ICD-10-CM

## 2014-11-05 DIAGNOSIS — Z951 Presence of aortocoronary bypass graft: Secondary | ICD-10-CM

## 2014-11-05 DIAGNOSIS — Z79899 Other long term (current) drug therapy: Secondary | ICD-10-CM | POA: Diagnosis not present

## 2014-11-05 DIAGNOSIS — R110 Nausea: Secondary | ICD-10-CM | POA: Diagnosis not present

## 2014-11-05 DIAGNOSIS — I480 Paroxysmal atrial fibrillation: Secondary | ICD-10-CM | POA: Diagnosis not present

## 2014-11-05 DIAGNOSIS — Z954 Presence of other heart-valve replacement: Secondary | ICD-10-CM

## 2014-11-05 DIAGNOSIS — R112 Nausea with vomiting, unspecified: Secondary | ICD-10-CM

## 2014-11-05 LAB — CBC WITH DIFFERENTIAL/PLATELET
BASOS ABS: 0 10*3/uL (ref 0.0–0.1)
Basophils Relative: 0.4 % (ref 0.0–3.0)
EOS PCT: 2.1 % (ref 0.0–5.0)
Eosinophils Absolute: 0.2 10*3/uL (ref 0.0–0.7)
HCT: 38.1 % (ref 36.0–46.0)
Hemoglobin: 12.5 g/dL (ref 12.0–15.0)
LYMPHS ABS: 1.8 10*3/uL (ref 0.7–4.0)
Lymphocytes Relative: 18.6 % (ref 12.0–46.0)
MCHC: 32.8 g/dL (ref 30.0–36.0)
MCV: 76.6 fl — ABNORMAL LOW (ref 78.0–100.0)
MONOS PCT: 6.3 % (ref 3.0–12.0)
Monocytes Absolute: 0.6 10*3/uL (ref 0.1–1.0)
NEUTROS PCT: 72.6 % (ref 43.0–77.0)
Neutro Abs: 7.1 10*3/uL (ref 1.4–7.7)
PLATELETS: 276 10*3/uL (ref 150.0–400.0)
RBC: 4.97 Mil/uL (ref 3.87–5.11)
RDW: 17.8 % — ABNORMAL HIGH (ref 11.5–15.5)
WBC: 9.8 10*3/uL (ref 4.0–10.5)

## 2014-11-05 LAB — HEPATIC FUNCTION PANEL
ALT: 14 U/L (ref 0–35)
AST: 21 U/L (ref 0–37)
Albumin: 4.1 g/dL (ref 3.5–5.2)
Alkaline Phosphatase: 134 U/L — ABNORMAL HIGH (ref 39–117)
BILIRUBIN DIRECT: 0.2 mg/dL (ref 0.0–0.3)
TOTAL PROTEIN: 7.7 g/dL (ref 6.0–8.3)
Total Bilirubin: 0.8 mg/dL (ref 0.2–1.2)

## 2014-11-05 LAB — T4, FREE: FREE T4: 1.26 ng/dL (ref 0.60–1.60)

## 2014-11-05 LAB — LIPID PANEL
CHOL/HDL RATIO: 2
Cholesterol: 167 mg/dL (ref 0–200)
HDL: 70.1 mg/dL (ref >39.00)
LDL Cholesterol: 82 mg/dL (ref 0–99)
NonHDL: 96.9
TRIGLYCERIDES: 75 mg/dL (ref 0.0–149.0)
VLDL: 15 mg/dL (ref 0.0–40.0)

## 2014-11-05 LAB — BASIC METABOLIC PANEL
BUN: 18 mg/dL (ref 6–23)
CALCIUM: 9.4 mg/dL (ref 8.4–10.5)
CO2: 27 mEq/L (ref 19–32)
Chloride: 104 mEq/L (ref 96–112)
Creatinine, Ser: 1.41 mg/dL — ABNORMAL HIGH (ref 0.40–1.20)
GFR: 38.48 mL/min — ABNORMAL LOW (ref >60.00)
GLUCOSE: 120 mg/dL — AB (ref 70–99)
POTASSIUM: 3.1 meq/L — AB (ref 3.5–5.1)
Sodium: 141 mEq/L (ref 135–145)

## 2014-11-05 LAB — TSH: TSH: 2.68 u[IU]/mL (ref 0.35–4.50)

## 2014-11-05 NOTE — Patient Instructions (Signed)
Medication Instructions:  STOP AMIODARONE   Labwork: LP/BMET/HFP/TSH/FT4/CBC  Testing/Procedures: NONE  Follow-Up: Your physician recommends that you schedule a follow-up appointment in: 3 MONTH OV/EKG

## 2014-11-05 NOTE — Progress Notes (Signed)
Cardiology Office Note   Date:  11/05/2014   ID:  Amber [name], DOB [birth year], MRN [id number]  PCP:  Amber Patella, MD  Cardiologist: Amber Clement MD  No chief complaint on file.     History of Present Illness: Amber [name] is a 79 y.o. female who presents for scheduled follow-up office visit. This pleasant 79 year old woman is seen for a followup office visit. . She has a past history of ischemic heart disease and a past history of aortic stenosis. She underwent CABG and aortic valve replacement with a tissue aortic valve on 01/10/09. Has a past history of paroxysmal atrial fibrillation. She is on Xarelto 15 mg daily. At her last visit she was in normal sinus rhythm. At the previous visit she was in atrial fibrillation and her amiodarone was restarted in a dose of 200 mg twice a day. When she returned for her subsequent visit she was back in sinus rhythm and today she is still in sinus rhythm. Echocardiogram on 11/08/13 showed normal prosthetic aortic valve function with peak gradient of 30 and a mean gradient of 18, normal left ventricular ejection fraction of 60-65% and grade 1 diastolic dysfunction. Lexa scan Myoview stress test on 11/09/13 showed no evidence of ischemia. She has been having some mild dizziness and mild lethargy. She has not been using any recent chest pain.  She has not had any recurrent attacks of gout since starting allopurinol. Since last visit he has had a severe problem with nausea and vomiting.  Dr. Dorena Cookey has been doing a GI workup.  DOS He was unrevealing and gastric emptying study was normal.  She has lost 13 pounds since February 2016.  She has also had some mild problems with her equilibrium.  I suspect that both the nausea and the equilibrium problems may be an adverse reaction to amiodarone.   Past Medical History  Diagnosis Date  . CAD (coronary artery disease) July 2010    s/p CABG  . S/P AVR (aortic valve replacement) July  2010  . 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 PAF (paroxysmal atrial fibrillation)   . Iron deficiency anemia   . HTN (hypertension)   . Diabetes mellitus type 2, diet-controlled   . Hyperlipidemia   . DJD (degenerative joint disease)   . Carotid artery disease   . GERD (gastroesophageal reflux disease)   . Esophagitis     s/p esophageal dilatation  . Pernicious anemia     on B12 injections  . Chronic anticoagulation   . Shoulder fracture, left September 2011  . Fracture     of left humerus   . Nonsustained ventricular tachycardia   . DJD (degenerative joint disease)   . Shortness of breath   . Orthopnea      She reports 2-3-pillow orthopnea  . Lower extremity edema     bilateral   . Dizziness     developed intermittent episodes of dizziness  . Chest tightness or pressure   . Aortic stenosis   . Osteopenia   . Iron deficiency anemia   . Vitamin B12 deficiency   . Dyslipidemia   . Hiatal hernia   . History of pulmonary embolus (PE) 15 years ago    Past Surgical History  Procedure Laterality Date  . Coronary artery bypass graft  12/2008    CABG x 1; Dr. [name]  . Aortic valve replacement  12/2008  . Cardiac catheterization  02/2010  .  Aspiration thrombectomy  07/2009  . Other surgical history      Total hysterectomy  . Cholecystectomy       Current Outpatient Prescriptions  Medication Sig Dispense Refill  . allopurinol (ZYLOPRIM) 100 MG tablet TAKE 1 TABLET (100 MG TOTAL) BY MOUTH DAILY. 30 tablet 3  . amLODipine (NORVASC) 10 MG tablet Take 1 tablet (10 mg total) by mouth daily. 90 tablet 3  . atorvastatin (LIPITOR) 40 MG tablet TAKE 1 TABLET BY MOUTH DAILY. 30 tablet 6  . Calcium Carbonate-Vit D-Min (CALCIUM 1200 PO) Take 1 tablet by mouth daily.     . clotrimazole-betamethasone (LOTRISONE) cream Apply 1 application topically as needed (for rash).     . colchicine 0.6 MG tablet 1 to 2 tablets one to two times a day as needed for acute  gout flare up 30 tablet 5  . dextromethorphan (DELSYM) 30 MG/5ML liquid Take 60 mg by mouth as needed (for cough).     Marland Kitchen. diptheria-tetanus toxoids [facility](DECAVAC) 2-2 LF/0.5ML injection     . famotidine (PEPCID) 20 MG tablet TAKE 1 TABLET (20 MG TOTAL) BY MOUTH 2 (TWO) TIMES DAILY. 60 tablet 1  . fluticasone (FLONASE) 50 MCG/ACT nasal spray Place 1 spray into the nose daily as needed for rhinitis.     . furosemide (LASIX) 40 MG tablet Take 40 mg by mouth daily.  3  . hydrALAZINE (APRESOLINE) 25 MG tablet Take 1 tablet (25 mg total) by mouth 3 (three) times daily. 90 tablet 3  . nitroGLYCERIN (NITROSTAT) 0.4 MG SL tablet Place 1 tablet (0.4 mg total) under the tongue every 5 (five) minutes as needed for chest pain. 25 tablet PRN  . omeprazole (PRILOSEC) 40 MG capsule Take 40 mg by mouth daily.  2  . ondansetron (ZOFRAN) 8 MG tablet Take 8 mg by mouth every 8 (eight) hours as needed. TAke 1/2-1 tablet by mouth every 8 hours as needed for nausea  1  . potassium chloride (K-DUR,KLOR-CON) 20 MEQ tablet Take 1 tablet (20 mEq total) by mouth 2 (two) times daily. 60 tablet 3  . promethazine (PHENERGAN) 25 MG tablet Take 25 mg by mouth as needed (for vomiting).   3  . raloxifene (EVISTA) 60 MG tablet Take 60 mg by mouth daily.      . vitamin B-12 (CYANOCOBALAMIN) 1000 MCG tablet Take 1,000 mcg by mouth daily.    [name]. XARELTO 15 MG TABS tablet TAKE 1 TABLET BY MOUTH EVERY DAY WITH SUPPER 30 tablet 3   No current facility-administered medications for this visit.    Allergies:   Bystolic; Lisinopril; Losartan; Metformin; Metoprolol; Amiodarone; and Fluconazole    Social History:  The patient  reports that she has never smoked. She has never used smokeless tobacco. She reports that she does not drink alcohol or use illicit drugs.   Family History:  The patient's family history includes Cancer in her brother; Heart disease in her brother, brother, and brother; Stroke in her father and mother.    ROS:  Please see  the history of present illness.   Otherwise, review of systems are positive for none.   All other systems are reviewed and negative.    PHYSICAL EXAM: VS:  BP 150/54 mmHg  Pulse 59  Ht 5\' 5"  (1.651 m)  Wt 177 lb (80.287 kg)  BMI 29.45 kg/m2 , BMI Body mass index is 29.45 kg/(m^2). GEN: Well nourished, well developed, in no acute distress HEENT: normal Neck: no JVD, carotid bruits, or masses Cardiac:  RRR; no murmurs, rubs, or gallops,no edema  Respiratory:  clear to auscultation bilaterally, normal work of breathing GI: soft, nontender, nondistended, + BS MS: no deformity or atrophy Skin: warm and dry, no rash Neuro:  Strength and sensation are intact Psych: euthymic mood, full affect   EKG:  EKG is ordered today. The ekg ordered today demonstrates sinus bradycardia at 55 bpm with premature atrial beats.  Nonspecific T-wave flattening is present.   Recent Labs: 05/06/2014: ALT 15; Hemoglobin 11.8*; Platelets 349.0; TSH 2.46 05/14/2014: BUN 18; Creatinine 1.3*; Potassium 4.6; Sodium 136    Lipid Panel    Component Value Date/Time   CHOL 166 05/06/2014 0949   TRIG 99.0 05/06/2014 0949   HDL 65.50 05/06/2014 0949   CHOLHDL 3 05/06/2014 0949   VLDL 19.8 05/06/2014 0949   LDLCALC 81 05/06/2014 0949      Wt Readings from Last 3 Encounters:  11/05/14 177 lb (80.287 kg)  08/07/14 190 lb 1.9 oz (86.238 kg)  05/06/14 193 lb 1.9 oz (87.599 kg)         ASSESSMENT AND PLAN:  1. Status post CABG and aortic valve replacement with a bioprosthetic aortic valve on 01/10/09 2. paroxysmal atrial fibrillation currently holding normal sinus rhythm on amiodarone.. She has remained on Xarelto 3. malaise and fatigue 4. Essential hypertension 5.  Nausea vomiting and weight loss which may be secondary to amiodarone toxicity.  We will stop her amiodarone now.  It may take several weeks before she notices any improvement   Current medicines are reviewed at length with the patient  today.  The patient has concerns regarding medicines.  The following changes have been made:  Stop amiodarone now.  Labs/ tests ordered today include:   Orders Placed This Encounter  Procedures  . Lipid panel  . Hepatic function panel  . Basic metabolic panel  . CBC with Differential/Platelet  . T4, free  . TSH  . EKG 12-Lead    Disposition: Stop amiodarone.  We are checking lab work today including CBC TSH and free T4 hepatic function panel basal metabolic panel and lipid panel. Return in 3 months for office visit and EKG  Signed, Amber Clement MD 11/05/2014 10:59 AM    [facility] [address] [place], [place], [place]  [address] Phone: [phone]; Fax: [phone]

## 2014-11-06 ENCOUNTER — Telehealth: Payer: Self-pay | Admitting: *Deleted

## 2014-11-06 MED ORDER — POTASSIUM CHLORIDE CRYS ER 20 MEQ PO TBCR: 20.0000 meq | EXTENDED_RELEASE_TABLET | Freq: Three times a day (TID) | ORAL | Status: AC

## 2014-11-06 NOTE — Telephone Encounter (Signed)
Advised patient of lab results and medication changes, verbalized understanding  

## 2014-11-06 NOTE — Telephone Encounter (Signed)
-----   Message from Cassell Clementhomas Brackbill, MD sent at 11/05/2014  6:50 PM EDT ----- Please report.  The potassium is too low.  The kidney function is not as good and the creatinine is higher.  I want her to reduce her furosemide to just 20 mg daily instead of 40.  I want her to increase her potassium chloride tablets up to 3 times a day.

## 2014-11-19 ENCOUNTER — Other Ambulatory Visit: Payer: Self-pay | Admitting: Cardiology

## 2014-11-19 NOTE — Telephone Encounter (Signed)
Per note 5.24.16 

## 2014-12-17 ENCOUNTER — Telehealth: Payer: Self-pay | Admitting: Cardiology

## 2014-12-17 DIAGNOSIS — R0602 Shortness of breath: Secondary | ICD-10-CM

## 2014-12-17 NOTE — Telephone Encounter (Signed)
Spoke with patient earlier today and she has been having shortness of breath since Friday night, worse with exertion Patient does not weigh herself daily Patient denies any increased swelling/edema Coughing, mostly nonproductive Discussed with Dr Ladona Ridgelaylor DOD and he recommended patient take Lasix 40 mg then and tomorrow 40 mg twice a day Advised patient and she will call back with update tomorrow afternoon, go to ED if worse. Patient verbalized understanding

## 2014-12-17 NOTE — Telephone Encounter (Signed)
Pt c/o Shortness Of Breath: STAT if SOB developed within the last 24 hours or pt is noticeably SOB on the phone  1. Are you currently SOB (can you hear that pt is SOB on the phone)? Pt stated she only has SOB when she is moving around, she is currently lying down and not feeling sob.  2. How long have you been experiencing SOB? Friday night  3. Are you SOB when sitting or when up moving around? Moving around  4. Are you currently experiencing any other symptoms? No   PT ONLY WANTS TO SPEAK TO MELINDA...Marland Kitchen..Marland Kitchen

## 2014-12-17 NOTE — Telephone Encounter (Signed)
Patient

## 2014-12-18 NOTE — Telephone Encounter (Signed)
Spoke with patient and she states she is breathing some better Does not think she is urinating a lot more  Advised to take the Lasix 40 mg tonight and in am Will call back in am to follow up

## 2014-12-19 NOTE — Telephone Encounter (Signed)
I would like her to increase her furosemide to 60 mg daily and I would like her to get a chest x-ray

## 2014-12-19 NOTE — Telephone Encounter (Signed)
Advised patient  Will get labs Monday 7/11

## 2014-12-19 NOTE — Telephone Encounter (Signed)
Did advise if worse to go to ED, verbalized understanding

## 2014-12-19 NOTE — Telephone Encounter (Signed)
Spoke with patient to follow up, states her breathing is some better but still wheezing She had Lasix extra 40 mg 7/5, 40 mg twice yesterday, and 40 mg today  Patient still not having increased urinary output Will forward to  Dr. Patty SermonsBrackbill for review

## 2014-12-20 ENCOUNTER — Ambulatory Visit
Admission: RE | Admit: 2014-12-20 | Discharge: 2014-12-20 | Disposition: A | Discharge status: Home / Self Care | Payer: PPO | Source: Ambulatory Visit | Attending: Cardiology | Admitting: Cardiology

## 2014-12-20 DIAGNOSIS — R0602 Shortness of breath: Secondary | ICD-10-CM

## 2014-12-22 ENCOUNTER — Emergency Department (HOSPITAL_COMMUNITY): Payer: PPO

## 2014-12-22 ENCOUNTER — Encounter (HOSPITAL_COMMUNITY): Payer: Self-pay

## 2014-12-22 ENCOUNTER — Emergency Department (HOSPITAL_COMMUNITY)
Admission: EM | Admit: 2014-12-22 | Discharge: 2015-01-13 | Disposition: E | Payer: PPO | Attending: Emergency Medicine | Admitting: Emergency Medicine

## 2014-12-22 DIAGNOSIS — E785 Hyperlipidemia, unspecified: Secondary | ICD-10-CM | POA: Insufficient documentation

## 2014-12-22 DIAGNOSIS — R531 Weakness: Secondary | ICD-10-CM | POA: Diagnosis present

## 2014-12-22 DIAGNOSIS — Z7901 Long term (current) use of anticoagulants: Secondary | ICD-10-CM | POA: Insufficient documentation

## 2014-12-22 DIAGNOSIS — R112 Nausea with vomiting, unspecified: Secondary | ICD-10-CM | POA: Insufficient documentation

## 2014-12-22 DIAGNOSIS — I10 Essential (primary) hypertension: Secondary | ICD-10-CM | POA: Diagnosis not present

## 2014-12-22 DIAGNOSIS — Z7951 Long term (current) use of inhaled steroids: Secondary | ICD-10-CM | POA: Diagnosis not present

## 2014-12-22 DIAGNOSIS — Z8781 Personal history of (healed) traumatic fracture: Secondary | ICD-10-CM | POA: Diagnosis not present

## 2014-12-22 DIAGNOSIS — Z862 Personal history of diseases of the blood and blood-forming organs and certain disorders involving the immune mechanism: Secondary | ICD-10-CM | POA: Insufficient documentation

## 2014-12-22 DIAGNOSIS — I2510 Atherosclerotic heart disease of native coronary artery without angina pectoris: Secondary | ICD-10-CM | POA: Diagnosis not present

## 2014-12-22 DIAGNOSIS — Z951 Presence of aortocoronary bypass graft: Secondary | ICD-10-CM | POA: Insufficient documentation

## 2014-12-22 DIAGNOSIS — Z9889 Other specified postprocedural states: Secondary | ICD-10-CM | POA: Diagnosis not present

## 2014-12-22 DIAGNOSIS — K219 Gastro-esophageal reflux disease without esophagitis: Secondary | ICD-10-CM | POA: Insufficient documentation

## 2014-12-22 DIAGNOSIS — R197 Diarrhea, unspecified: Secondary | ICD-10-CM | POA: Diagnosis not present

## 2014-12-22 DIAGNOSIS — Z86711 Personal history of pulmonary embolism: Secondary | ICD-10-CM | POA: Diagnosis not present

## 2014-12-22 DIAGNOSIS — E119 Type 2 diabetes mellitus without complications: Secondary | ICD-10-CM | POA: Diagnosis not present

## 2014-12-22 DIAGNOSIS — J159 Unspecified bacterial pneumonia: Secondary | ICD-10-CM | POA: Insufficient documentation

## 2014-12-22 DIAGNOSIS — I469 Cardiac arrest, cause unspecified: Secondary | ICD-10-CM | POA: Diagnosis not present

## 2014-12-22 DIAGNOSIS — Z79899 Other long term (current) drug therapy: Secondary | ICD-10-CM | POA: Diagnosis not present

## 2014-12-22 DIAGNOSIS — I480 Paroxysmal atrial fibrillation: Secondary | ICD-10-CM | POA: Diagnosis not present

## 2014-12-22 DIAGNOSIS — E538 Deficiency of other specified B group vitamins: Secondary | ICD-10-CM | POA: Insufficient documentation

## 2014-12-22 DIAGNOSIS — I252 Old myocardial infarction: Secondary | ICD-10-CM | POA: Insufficient documentation

## 2014-12-22 DIAGNOSIS — Z8739 Personal history of other diseases of the musculoskeletal system and connective tissue: Secondary | ICD-10-CM | POA: Insufficient documentation

## 2014-12-22 DIAGNOSIS — J189 Pneumonia, unspecified organism: Secondary | ICD-10-CM

## 2014-12-22 LAB — COMPREHENSIVE METABOLIC PANEL
ALBUMIN: 2.7 g/dL — AB (ref 3.5–5.0)
ALK PHOS: 258 U/L — AB (ref 38–126)
ALT: 346 U/L — AB (ref 14–54)
AST: 335 U/L — ABNORMAL HIGH (ref 15–41)
Anion gap: 23 — ABNORMAL HIGH (ref 5–15)
BILIRUBIN TOTAL: 3.9 mg/dL — AB (ref 0.3–1.2)
BUN: 82 mg/dL — ABNORMAL HIGH (ref 6–20)
CO2: 8 mmol/L — ABNORMAL LOW (ref 22–32)
Calcium: 7.3 mg/dL — ABNORMAL LOW (ref 8.9–10.3)
Chloride: 106 mmol/L (ref 101–111)
Creatinine, Ser: 3.9 mg/dL — ABNORMAL HIGH (ref 0.44–1.00)
GFR calc Af Amer: 12 mL/min — ABNORMAL LOW (ref >60)
GFR calc non Af Amer: 10 mL/min — ABNORMAL LOW (ref >60)
Glucose, Bld: 90 mg/dL (ref 65–99)
Potassium: 3.7 mmol/L (ref 3.5–5.1)
Sodium: 137 mmol/L (ref 135–145)
Total Protein: 5.5 g/dL — ABNORMAL LOW (ref 6.5–8.1)

## 2014-12-22 LAB — CBC WITH DIFFERENTIAL/PLATELET
Basophils Absolute: 0 10*3/uL (ref 0.0–0.1)
Basophils Relative: 0 % (ref 0–1)
Eosinophils Absolute: 0 10*3/uL (ref 0.0–0.7)
Eosinophils Relative: 0 % (ref 0–5)
HCT: 30 % — ABNORMAL LOW (ref 36.0–46.0)
HEMOGLOBIN: 9.5 g/dL — AB (ref 12.0–15.0)
LYMPHS ABS: 0.8 10*3/uL (ref 0.7–4.0)
Lymphocytes Relative: 3 % — ABNORMAL LOW (ref 12–46)
MCH: 24.7 pg — ABNORMAL LOW (ref 26.0–34.0)
MCHC: 31.7 g/dL (ref 30.0–36.0)
MCV: 77.9 fL — ABNORMAL LOW (ref 78.0–100.0)
MONOS PCT: 6 % (ref 3–12)
Monocytes Absolute: 1.4 10*3/uL — ABNORMAL HIGH (ref 0.1–1.0)
NEUTROS PCT: 91 % — AB (ref 43–77)
Neutro Abs: 20.2 10*3/uL — ABNORMAL HIGH (ref 1.7–7.7)
Platelets: 235 10*3/uL (ref 150–400)
RBC: 3.85 MIL/uL — ABNORMAL LOW (ref 3.87–5.11)
RDW: 17.1 % — ABNORMAL HIGH (ref 11.5–15.5)
WBC: 22.4 10*3/uL — AB (ref 4.0–10.5)

## 2014-12-22 LAB — I-STAT TROPONIN, ED: Troponin i, poc: 0.41 ng/mL (ref 0.00–0.08)

## 2014-12-22 LAB — I-STAT CG4 LACTIC ACID, ED: Lactic Acid, Venous: 9.38 mmol/L (ref 0.5–2.0)

## 2014-12-22 LAB — BRAIN NATRIURETIC PEPTIDE: B NATRIURETIC PEPTIDE 5: 2240.1 pg/mL — AB (ref 0.0–100.0)

## 2014-12-22 MED ORDER — ETOMIDATE 2 MG/ML IV SOLN: 10.0000 mg | Freq: Once | INTRAVENOUS | Status: DC

## 2014-12-22 MED ORDER — ATROPINE SULFATE 1 MG/ML IJ SOLN
INTRAMUSCULAR | Status: DC | PRN
  Administered 2014-12-22 (×5): 1 mg via INTRAVENOUS

## 2014-12-22 MED ORDER — DEXTROSE 50 % IV SOLN
INTRAVENOUS | Status: DC | PRN
Start: 2014-12-22 — End: 2014-12-23
  Administered 2014-12-22: 1 via INTRAVENOUS

## 2014-12-22 MED ORDER — ROCURONIUM BROMIDE 50 MG/5ML IV SOLN
INTRAVENOUS | Status: DC | PRN
Start: 2014-12-22 — End: 2014-12-23
  Administered 2014-12-22: 100 mg via INTRAVENOUS

## 2014-12-22 MED ORDER — SODIUM CHLORIDE 0.9 % IV BOLUS (SEPSIS): 500.0000 mL | INTRAVENOUS | Status: DC

## 2014-12-22 MED ORDER — ROCURONIUM BROMIDE 50 MG/5ML IV SOLN
INTRAVENOUS | Status: AC
  Filled 2014-12-22: qty 2

## 2014-12-22 MED ORDER — SODIUM CHLORIDE 0.9 % IV BOLUS (SEPSIS): 1000.0000 mL | Freq: Once | INTRAVENOUS | Status: DC

## 2014-12-22 MED ORDER — ETOMIDATE 2 MG/ML IV SOLN
INTRAVENOUS | Status: DC | PRN
  Administered 2014-12-22: 10 mg via INTRAVENOUS

## 2014-12-22 MED ORDER — NOREPINEPHRINE BITARTRATE 1 MG/ML IV SOLN
2.0000 ug/min | INTRAVENOUS | Status: DC
  Filled 2014-12-22 (×2): qty 4

## 2014-12-22 MED ORDER — VANCOMYCIN HCL 10 G IV SOLR
1500.0000 mg | INTRAVENOUS | Status: DC
  Filled 2014-12-22: qty 1500

## 2014-12-22 MED ORDER — ROCURONIUM BROMIDE 50 MG/5ML IV SOLN
100.0000 mg | Freq: Once | INTRAVENOUS | Status: DC
  Filled 2014-12-22: qty 10

## 2014-12-22 MED ORDER — LIDOCAINE HCL (CARDIAC) 20 MG/ML IV SOLN
INTRAVENOUS | Status: AC
  Filled 2014-12-22: qty 5

## 2014-12-22 MED ORDER — SODIUM BICARBONATE 8.4 % IV SOLN
INTRAVENOUS | Status: DC | PRN
  Administered 2014-12-22 (×2): 100 meq via INTRAVENOUS

## 2014-12-22 MED ORDER — ETOMIDATE 2 MG/ML IV SOLN
INTRAVENOUS | Status: AC
  Filled 2014-12-22: qty 20

## 2014-12-22 MED ORDER — SUCCINYLCHOLINE CHLORIDE 20 MG/ML IJ SOLN
INTRAMUSCULAR | Status: AC
  Filled 2014-12-22: qty 1

## 2014-12-22 MED ORDER — PROPOFOL 1000 MG/100ML IV EMUL
INTRAVENOUS | Status: AC
  Filled 2014-12-22: qty 100

## 2014-12-22 MED ORDER — SODIUM BICARBONATE 8.4 % IV SOLN
INTRAVENOUS | Status: AC
  Filled 2014-12-22: qty 50

## 2014-12-22 MED ORDER — SODIUM CHLORIDE 0.9 % IV BOLUS (SEPSIS): 1000.0000 mL | INTRAVENOUS | Status: DC

## 2014-12-22 MED ORDER — EPINEPHRINE HCL 0.1 MG/ML IJ SOSY
PREFILLED_SYRINGE | INTRAMUSCULAR | Status: DC | PRN
  Administered 2014-12-22 (×6): 1 mg via INTRAVENOUS

## 2014-12-22 MED ORDER — PROPOFOL 1000 MG/100ML IV EMUL
5.0000 ug/kg/min | INTRAVENOUS | Status: DC
Start: 2014-12-22 — End: 2014-12-23
  Filled 2014-12-22: qty 100

## 2014-12-22 MED ORDER — EPINEPHRINE HCL 1 MG/ML IJ SOLN
0.5000 ug/min | INTRAVENOUS | Status: DC
  Filled 2014-12-22: qty 4

## 2014-12-22 MED ORDER — CALCIUM CHLORIDE 10 % IV SOLN
INTRAVENOUS | Status: DC | PRN
  Administered 2014-12-22: 1 g via INTRAVENOUS

## 2014-12-22 MED ORDER — PIPERACILLIN-TAZOBACTAM 3.375 G IVPB 30 MIN: 3.3750 g | Freq: Once | INTRAVENOUS | Status: DC

## 2014-12-23 ENCOUNTER — Other Ambulatory Visit: Payer: PPO

## 2015-01-13 NOTE — Code Documentation (Signed)
Pulse check. No pulse. CPR restarted.

## 2015-01-13 NOTE — Code Documentation (Addendum)
Pulse check. No pulse. V. Fib noted on Zoll. 1 defib given. CPR restarted

## 2015-01-13 NOTE — Code Documentation (Signed)
Cardiac activity noted. CPR restarted.

## 2015-01-13 NOTE — ED Notes (Signed)
To room via EMS.  Onset 1 week cough, pt went to PCP 2 days ago,lab work and xray results pending.  Today pt feels really weak.   Initial BP for EMS was in the 80's systolic, last BP was 90/60.

## 2015-01-13 NOTE — Code Documentation (Signed)
Pulse check. No pulse noted. U/S examination for cardiac activity being initiated by Dr. Gwendolyn GrantWalden.

## 2015-01-13 NOTE — ED Provider Notes (Signed)
CSN: [id number]     Arrival date & time 01/11/15  1548 History   First MD Initiated Contact with Patient 2015-01-11 [phone]     Chief Complaint  Patient presents with  . Weakness     (Consider location/radiation/quality/duration/timing/severity/associated sxs/prior Treatment) HPI Comments: Here with shortness of breath, weakness. Has had progressive weakness over the past week. Family thought she had a GI illness since she's had vomiting and diarrhea. She denied any chest pain. She opens her cardiologist and PCP, Dr. Patty Sermons, do some leg swelling a few days ago. He recommended a chest x-ray and increasing her Lasix. She has increase Lasix but has not noted an increase in her urinary output. Chest x-ray showed bilateral infiltrates and diffuse volume overload.  Patient is a 79 y.o. female presenting with weakness. The history is provided by the patient and a relative.  Weakness This is a new problem. The current episode started more than 2 days ago. The problem occurs constantly. The problem has been gradually worsening. Pertinent negatives include no chest pain and no shortness of breath. Nothing aggravates the symptoms. Nothing relieves the symptoms.    Past Medical History  Diagnosis Date  . CAD (coronary artery disease) July 2010    s/p CABG  . S/P AVR (aortic valve replacement) July 2010  . 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 PAF (paroxysmal atrial fibrillation)   . Iron deficiency anemia   . HTN (hypertension)   . Diabetes mellitus type 2, diet-controlled   . Hyperlipidemia   . DJD (degenerative joint disease)   . Carotid artery disease   . GERD (gastroesophageal reflux disease)   . Esophagitis     s/p esophageal dilatation  . Pernicious anemia     on B12 injections  . Chronic anticoagulation   . Shoulder fracture, left September 2011  . Fracture     of left humerus   . Nonsustained ventricular tachycardia   . DJD (degenerative joint  disease)   . Shortness of breath   . Orthopnea      She reports 2-3-pillow orthopnea  . Lower extremity edema     bilateral   . Dizziness     developed intermittent episodes of dizziness  . Chest tightness or pressure   . Aortic stenosis   . Osteopenia   . Iron deficiency anemia   . Vitamin B12 deficiency   . Dyslipidemia   . Hiatal hernia   . History of pulmonary embolus (PE) 15 years ago   Past Surgical History  Procedure Laterality Date  . Coronary artery bypass graft  12/2008    CABG x 1; Dr. [name]  . Aortic valve replacement  12/2008  . Cardiac catheterization  02/2010  . Aspiration thrombectomy  07/2009  . Other surgical history      Total hysterectomy  . Cholecystectomy     Family History  Problem Relation Age of Onset  . Stroke Mother   . Stroke Father   . Heart disease Brother   . Heart disease Brother   . Heart disease Brother   . Cancer Brother    History  Substance Use Topics  . Smoking status: Never Smoker   . Smokeless tobacco: Never Used  . Alcohol Use: No   OB History    No data available     Review of Systems  Constitutional: Negative for fever.  Respiratory: Negative for cough and shortness of breath.   Cardiovascular: Negative for chest pain  and leg swelling.  Gastrointestinal: Positive for nausea, vomiting (for several days, ceased 2 days ago) and diarrhea (persistent).  Neurological: Positive for weakness.  All other systems reviewed and are negative.     Allergies  Bystolic; Lisinopril; Losartan; Metformin; Metoprolol; Amiodarone; and Fluconazole  Home Medications   Prior to Admission medications   Medication Sig Start Date End Date Taking? Authorizing Provider  allopurinol (ZYLOPRIM) 100 MG tablet TAKE 1 TABLET (100 MG TOTAL) BY MOUTH DAILY. 11/19/14   Cassell Clement, MD  amLODipine (NORVASC) 10 MG tablet Take 1 tablet (10 mg total) by mouth daily. 08/02/13   Cassell Clement, MD  atorvastatin (LIPITOR) 40 MG tablet TAKE 1 TABLET BY  MOUTH DAILY. 06/24/14   Cassell Clement, MD  Calcium Carbonate-Vit D-Min (CALCIUM 1200 PO) Take 1 tablet by mouth daily.     Historical Provider, MD  clotrimazole-betamethasone (LOTRISONE) cream Apply 1 application topically as needed (for rash).  03/22/13   Historical Provider, MD  colchicine 0.6 MG tablet 1 to 2 tablets one to two times a day as needed for acute gout flare up 10/23/13   Cassell Clement, MD  dextromethorphan (DELSYM) 30 MG/5ML liquid Take 60 mg by mouth as needed (for cough).     Historical Provider, MD  diptheria-tetanus toxoids [facility]) 2-2 LF/0.5ML injection  10/04/13   Historical Provider, MD  famotidine (PEPCID) 20 MG tablet TAKE 1 TABLET (20 MG TOTAL) BY MOUTH 2 (TWO) TIMES DAILY. 09/16/14   Cassell Clement, MD  fluticasone (FLONASE) 50 MCG/ACT nasal spray Place 1 spray into the nose daily as needed for rhinitis.  12/29/11   Historical Provider, MD  furosemide (LASIX) 40 MG tablet Take 40 mg by mouth as directed. 1/2 tablet by daily 06/29/14   Historical Provider, MD  hydrALAZINE (APRESOLINE) 25 MG tablet Take 1 tablet (25 mg total) by mouth 3 (three) times daily. 08/15/13   Kathleene Hazel, MD  nitroGLYCERIN (NITROSTAT) 0.4 MG SL tablet Place 1 tablet (0.4 mg total) under the tongue every 5 (five) minutes as needed for chest pain. 04/08/14   Cassell Clement, MD  omeprazole (PRILOSEC) 40 MG capsule Take 40 mg by mouth daily. 10/20/14   Historical Provider, MD  ondansetron (ZOFRAN) 8 MG tablet Take 8 mg by mouth every 8 (eight) hours as needed. TAke 1/2-1 tablet by mouth every 8 hours as needed for nausea 08/30/14   Historical Provider, MD  potassium chloride SA (K-DUR,KLOR-CON) 20 MEQ tablet Take 1 tablet (20 mEq total) by mouth 3 (three) times daily. 11/06/14   Cassell Clement, MD  promethazine (PHENERGAN) 25 MG tablet Take 25 mg by mouth as needed (for vomiting).  04/04/14   Historical Provider, MD  raloxifene (EVISTA) 60 MG tablet Take 60 mg by mouth daily.      Historical  Provider, MD  vitamin B-12 (CYANOCOBALAMIN) 1000 MCG tablet Take 1,000 mcg by mouth daily.    Historical Provider, MD  XARELTO 15 MG TABS tablet TAKE 1 TABLET BY MOUTH EVERY DAY WITH SUPPER 10/04/14   Cassell Clement, MD   BP 81/57 mmHg  Pulse 86  Temp(Src) 95.9 F (35.5 C) (Rectal)  Resp 28  Ht 5\' 5"  (1.651 m)  SpO2 94% Physical Exam  Constitutional: She is oriented to person, place, and time. She appears well-developed and well-nourished. No distress.  HENT:  Head: Normocephalic and atraumatic.  Mouth/Throat: Oropharynx is clear and moist.  Eyes: EOM are normal. Pupils are equal, round, and reactive to light.  Neck: Normal range of motion. Neck  supple.  Cardiovascular: Normal rate and regular rhythm.  Exam reveals no friction rub.   No murmur heard. Pulmonary/Chest: She is in respiratory distress (moderate). She has no wheezes. She has rales (R base, mild).  Abdominal: Soft. She exhibits no distension. There is no tenderness. There is no rebound.  Musculoskeletal: Normal range of motion. She exhibits no edema.  Neurological: She is alert and oriented to person, place, and time.  Skin: No rash noted. She is not diaphoretic.  Nursing note and vitals reviewed.   ED Course  INTUBATION Date/Time: 12-Oct-2014 7:04 PM Performed by: Elwin MochaWALDEN, Radie Berges Authorized by: Elwin MochaWALDEN, Sora Vrooman Consent: The procedure was performed in an emergent situation. Time out: Immediately prior to procedure a "time out" was called to verify the correct patient, procedure, equipment, support staff and site/side marked as required. Indications: respiratory distress Intubation method: video-assisted Patient status: paralyzed (RSI) Sedatives: etomidate Paralytic: rocuronium Tube size: 7.5 mm Tube type: cuffed Number of attempts: 1 Cricoid pressure: no Cords visualized: yes Post-procedure assessment: chest rise Breath sounds: equal Cuff inflated: yes ETT to lip: 21 cm Tube secured with: ETT holder and adhesive  tape Patient tolerance: Patient tolerated the procedure well with no immediate complications   (including critical care time) Labs Review Labs Reviewed  COMPREHENSIVE METABOLIC PANEL - Abnormal; Notable for the following:    CO2 8 (*)    BUN 82 (*)    Creatinine, Ser 3.90 (*)    Calcium 7.3 (*)    Total Protein 5.5 (*)    Albumin 2.7 (*)    AST 335 (*)    ALT 346 (*)    Alkaline Phosphatase 258 (*)    Total Bilirubin 3.9 (*)    GFR calc non Af Amer 10 (*)    GFR calc Af Amer 12 (*)    Anion gap 23 (*)    All other components within normal limits  CBC WITH DIFFERENTIAL/PLATELET - Abnormal; Notable for the following:    WBC 22.4 (*)    RBC 3.85 (*)    Hemoglobin 9.5 (*)    HCT 30.0 (*)    MCV 77.9 (*)    MCH 24.7 (*)    RDW 17.1 (*)    Neutrophils Relative % 91 (*)    Neutro Abs 20.2 (*)    Lymphocytes Relative 3 (*)    Monocytes Absolute 1.4 (*)    All other components within normal limits  BRAIN NATRIURETIC PEPTIDE - Abnormal; Notable for the following:    B Natriuretic Peptide 2240.1 (*)    All other components within normal limits  I-STAT CG4 LACTIC ACID, ED - Abnormal; Notable for the following:    Lactic Acid, Venous 9.38 (*)    All other components within normal limits  I-STAT TROPOININ, ED - Abnormal; Notable for the following:    Troponin i, poc 0.41 (*)    All other components within normal limits  CULTURE, BLOOD (ROUTINE X 2)  CULTURE, BLOOD (ROUTINE X 2)  URINE CULTURE  URINALYSIS, ROUTINE W REFLEX MICROSCOPIC (NOT AT [facility])  I-STAT CG4 LACTIC ACID, ED    Imaging Review Dg Chest Port 1 View  12-Oct-2014   CLINICAL DATA:  Short of breath  EXAM: PORTABLE CHEST - 1 VIEW  COMPARISON:  12/20/2014  FINDINGS: Aortic valve replacement. Cardiac enlargement. Pulmonary vascular congestion shows mild progression. Possible early edema. Bilateral pleural effusions and bibasilar atelectasis unchanged from the prior study.  IMPRESSION: Congestive heart failure with slight  progression of vascular congestion and mild  edema.   Electronically Signed   By: Marlan Palau M.D.   On: Jan 20, 2015 16:38     EKG Interpretation None      EKG: LBBB, rate 96, LBBB new.  Cardiopulmonary Resuscitation (CPR) Procedure Note Directed/Performed by: Dagmar Hait I personally directed ancillary staff and/or performed CPR in an effort to regain return of spontaneous circulation and to maintain cardiac, neuro and systemic perfusion.   CRITICAL CARE Performed by: Dagmar Hait   Total critical care time: 75 minutes  Critical care time was exclusive of separately billable procedures and treating other patients.  Critical care was necessary to treat or prevent imminent or life-threatening deterioration.  Critical care was time spent personally by me on the following activities: development of treatment plan with patient and/or surrogate as well as nursing, discussions with consultants, evaluation of patient's response to treatment, examination of patient, obtaining history from patient or surrogate, ordering and performing treatments and interventions, ordering and review of laboratory studies, ordering and review of radiographic studies, pulse oximetry and re-evaluation of patient's condition.   MDM   Final diagnoses:  Cardiac arrest  Community acquired pneumonia    79 year old female here with weakness. GI symptoms like vomiting and diarrhea for the past week. She also had chest x-ray done 3 days ago which showed bilateral L4 traits and volume overload. Here does not appear volume overloaded. She is not wet on lung exam. She has clear lung fields except for some mild rales in the right base.  Here she is hypotensive, to Neck, hypothermic. Warm with warm blankets, give fluids since she does not sound fluid overloaded on lung exam.  She also has right upper quadrant pain and jaundice. Concern for some type of intra-abdominal process. Will stabilize with  fluids and get her BP up before we send her to the CT scanner. EKG shows new LBBB. With hypotension, I spoke with Dr. Ladona Ridgel to ensure this doesn't sound like Cardiogenic Shock. He believes she needs volume resuscitation and does not meet criteria for cardiogenic shock. I agree with that assessment, she's like hypovolemic with her week of diarrhea.  Patient began to decompensate was worsening mental status and worsening respiratory status. Lactate returned at 9.4 signaling she is anextremely sick patient. Will use low-dose etomidate for intubation since she's had softer BPs. Prior to intubation, sats normal, blood pressure in the 80s with maps in the high 50s and low 60s. Intubation as above, no immediate complications. Levophed started after intubation since we sedated her to help with her blood pressures. Patient began dropping her heart rate a few minutes after intubation. Atropine was given after heart rates dropped into the 30s. Heart rate continue to drop down into the 20s and then dropped further than that. CPR was initiated. We did get a return of pulses after a few cycles of CPR, however patient quickly lost pulses again despite [place] attempting to pace her. He did have V. fib after her first cycle CPR which was shocked 120 J. Post-assessment rhythm was asystole. Multiple rounds of CPR continued without return of spontaneous circulation. On a pulse check she had very minimal cardiac activity, both cardiac activity we continued. We continued CPR. I spoke with family and brought them in the room so they could watch the assessment. Once I brought family in, she has shockable rhythm of V. Tach. She was shocked with any return of spontaneous circulation. We did another 2 minutes of CPR. Family asked me to stop CPR. Time of  Death 1744.       Elwin Mocha, MD 12-24-14 Ebony Cargo

## 2015-01-13 NOTE — Code Documentation (Signed)
Pulse check. No pulse. CPR restarted.

## 2015-01-13 NOTE — Code Documentation (Addendum)
Pulse check. V. Fib on monitor. 1 Defib given. CPR restarted.

## 2015-01-13 NOTE — Code Documentation (Signed)
No pulse noted. CPR started.  

## 2015-01-13 NOTE — Code Documentation (Addendum)
Pulses lost. CPR restarted 

## 2015-01-13 NOTE — Code Documentation (Signed)
Pulse check. No pulse. Time of death called by Dr. Gwendolyn GrantWalden at 17:44.

## 2015-01-13 NOTE — Code Documentation (Signed)
Epi drip increased to 300 ml/hr.

## 2015-01-13 NOTE — Code Documentation (Signed)
Pulse check. Pulse present in the 20s. Pacing started at 106 miliamps/68 bpm.

## 2015-01-13 NOTE — Code Documentation (Signed)
Family at bedside. Reassurance provided by Dr. Gwendolyn GrantWalden and Chaplain.

## 2015-01-13 NOTE — Progress Notes (Signed)
   17-Aug-2014 1800  Clinical Encounter Type  Visited With Family;Other (Comment) (Family Pastor)  Visit Type Death  Referral From Nurse  Consult/Referral To Chaplain  Spiritual Encounters  Spiritual Needs Grief support  Stress Factors  Family Stress Factors Loss  CH called to post CPR to help liaison with family and offer grief support as needed; family Renato Gailsastor attending to spiritual needs of family and is present.  CH available for additional support as needed.

## 2015-01-13 NOTE — Procedures (Signed)
Intubation Procedure Note Amber MungoMary F [name] [id number] 05/23/1936  Procedure: Intubation Indications: Airway protection and maintenance  Procedure Details Consent: Risks of procedure as well as the alternatives and risks of each were explained to the (patient/caregiver).  Consent for procedure obtained. Time Out: Verified patient identification, verified procedure, site/side was marked, verified correct patient position, special equipment/implants available, medications/allergies/relevent history reviewed, required imaging and test results available.  Performed  Maximum sterile technique was used including gloves, gown, hand hygiene and mask.  3    Evaluation Hemodynamic Status: BP stable throughout; O2 sats: stable throughout Patient's Current Condition: unstable Complications: Complications of Pt expired Patient did tolerate procedure well. Chest X-ray ordered to verify placement.  CXR: pending.  Pt intubated for airway protection. Pt intubated using glidescope with #3 blade and 7.5 ett,secured at 21 at the lip. Pt had bilateral bs, positive color change on etco2, and CXR pending.   Amber ShiverKelley, Amber [name] 12/07/2014

## 2015-01-13 NOTE — Code Documentation (Addendum)
Pulse check. Brady rhythm noted. Pacing initiated at 110 miliamps/ 68 bpm.

## 2015-01-13 NOTE — Code Documentation (Addendum)
CPR restarted. Epi drip initiated with assistance of Dr. Gwendolyn GrantWalden. He placed 1 mg of epi in a 1000 ml bag of normal saline. Ordered rate 120 ml/hr.

## 2015-01-13 NOTE — Code Documentation (Signed)
Family at bedside. 

## 2015-01-13 NOTE — Code Documentation (Signed)
Patient time of death occurred at 1744. 

## 2015-01-13 DEATH — deceased

## 2015-02-07 ENCOUNTER — Ambulatory Visit: Payer: PPO | Admitting: Cardiology
